# Patient Record
Sex: Male | Born: 1960 | Race: Black or African American | Hispanic: No | Marital: Single | Smoking: Current every day smoker
Health system: Southern US, Community
[De-identification: ages and names within clinical notes are randomized; demographics above are authoritative.]

## PROBLEM LIST (undated history)

## (undated) DIAGNOSIS — I739 Peripheral vascular disease, unspecified: Secondary | ICD-10-CM

## (undated) DIAGNOSIS — E785 Hyperlipidemia, unspecified: Secondary | ICD-10-CM

## (undated) DIAGNOSIS — I4891 Unspecified atrial fibrillation: Secondary | ICD-10-CM

## (undated) DIAGNOSIS — J45909 Unspecified asthma, uncomplicated: Secondary | ICD-10-CM

## (undated) DIAGNOSIS — R0600 Dyspnea, unspecified: Secondary | ICD-10-CM

## (undated) DIAGNOSIS — Z955 Presence of coronary angioplasty implant and graft: Secondary | ICD-10-CM

## (undated) DIAGNOSIS — I2510 Atherosclerotic heart disease of native coronary artery without angina pectoris: Secondary | ICD-10-CM

## (undated) DIAGNOSIS — R519 Headache, unspecified: Secondary | ICD-10-CM

## (undated) DIAGNOSIS — I10 Essential (primary) hypertension: Secondary | ICD-10-CM

## (undated) DIAGNOSIS — M1990 Unspecified osteoarthritis, unspecified site: Secondary | ICD-10-CM

## (undated) DIAGNOSIS — R42 Dizziness and giddiness: Secondary | ICD-10-CM

## (undated) DIAGNOSIS — G4730 Sleep apnea, unspecified: Secondary | ICD-10-CM

## (undated) DIAGNOSIS — J449 Chronic obstructive pulmonary disease, unspecified: Secondary | ICD-10-CM

## (undated) HISTORY — PX: OTHER SURGICAL HISTORY: SHX169

---
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
## 2014-08-20 DIAGNOSIS — Z955 Presence of coronary angioplasty implant and graft: Secondary | ICD-10-CM

## 2014-08-20 HISTORY — DX: Presence of coronary angioplasty implant and graft: Z95.5

## 2019-03-16 ENCOUNTER — Other Ambulatory Visit: Payer: Self-pay

## 2019-03-16 ENCOUNTER — Telehealth: Payer: Self-pay | Admitting: Gastroenterology

## 2019-03-16 DIAGNOSIS — Z1211 Encounter for screening for malignant neoplasm of colon: Secondary | ICD-10-CM

## 2019-03-16 NOTE — Telephone Encounter (Signed)
Patient called & l/m he would like to scheduled a colonoscopy & is in the work-q.

## 2019-03-16 NOTE — Telephone Encounter (Signed)
Gastroenterology Pre-Procedure Review  Request Date: 05/01/19 Requesting Physician: Dr. Bonna Gains  PATIENT REVIEW QUESTIONS: The patient responded to the following health history questions as indicated:    1. Are you having any GI issues? no 2. Do you have a personal history of Polyps? no 3. Do you have a family history of Colon Cancer or Polyps? no 4. Diabetes Mellitus? no 5. Joint replacements in the past 12 months?no 6. Major health problems in the past 3 months?no 7. Any artificial heart valves, MVP, or defibrillator?no    MEDICATIONS & ALLERGIES:    Patient reports the following regarding taking any anticoagulation/antiplatelet therapy:   Plavix, Coumadin, Eliquis, Xarelto, Lovenox, Pradaxa, Brilinta, or Effient? yes (Eliquis, Plavix blood thinner request sent to [facility] Aspirin? no  Patient confirms/reports the following medications:  No current outpatient medications on file.   No current facility-administered medications for this visit.     Patient confirms/reports the following allergies:  Not on File  No orders of the defined types were placed in this encounter.   AUTHORIZATION INFORMATION Primary Insurance: 1D#: Group #:  Secondary Insurance: 1D#: Group #:  SCHEDULE INFORMATION: Date: 05/01/19 Time: Location:[facility]

## 2019-04-13 ENCOUNTER — Telehealth: Payer: Self-pay

## 2019-04-13 NOTE — Telephone Encounter (Signed)
Patient has been advised to stop Eliquis and Plavix 2 days prior to procedure and resume 2 days after the procedure-per Dr. Dominica Severin blood thinner request received.  Pts colonoscopy is scheduled for 05/01/19 and he states he has received his instruction packet.  Thanks [employer]

## 2019-04-28 ENCOUNTER — Other Ambulatory Visit: Payer: Self-pay

## 2019-04-28 ENCOUNTER — Other Ambulatory Visit
Admission: RE | Admit: 2019-04-28 | Discharge: 2019-04-28 | Disposition: A | Discharge status: Home / Self Care | Payer: Medicare HMO | Source: Ambulatory Visit | Attending: Gastroenterology | Admitting: Gastroenterology

## 2019-04-28 DIAGNOSIS — Z20828 Contact with and (suspected) exposure to other viral communicable diseases: Secondary | ICD-10-CM | POA: Insufficient documentation

## 2019-04-28 DIAGNOSIS — Z01812 Encounter for preprocedural laboratory examination: Secondary | ICD-10-CM | POA: Diagnosis present

## 2019-04-28 LAB — SARS CORONAVIRUS 2 (TAT 6-24 HRS): SARS Coronavirus 2: NEGATIVE

## 2019-05-01 ENCOUNTER — Ambulatory Visit
Admission: RE | Admit: 2019-05-01 | Discharge: 2019-05-01 | Disposition: A | Discharge status: Home / Self Care | Payer: Medicare HMO | Source: Ambulatory Visit | Attending: Gastroenterology | Admitting: Gastroenterology

## 2019-05-01 ENCOUNTER — Encounter
Admission: RE | Disposition: A | Discharge status: Home / Self Care | Payer: Self-pay | Source: Ambulatory Visit | Attending: Gastroenterology

## 2019-05-01 ENCOUNTER — Ambulatory Visit: Payer: Medicare HMO | Admitting: Anesthesiology

## 2019-05-01 ENCOUNTER — Encounter: Payer: Self-pay | Admitting: *Deleted

## 2019-05-01 DIAGNOSIS — F17210 Nicotine dependence, cigarettes, uncomplicated: Secondary | ICD-10-CM | POA: Diagnosis not present

## 2019-05-01 DIAGNOSIS — Z1211 Encounter for screening for malignant neoplasm of colon: Secondary | ICD-10-CM | POA: Diagnosis not present

## 2019-05-01 DIAGNOSIS — K635 Polyp of colon: Secondary | ICD-10-CM | POA: Diagnosis not present

## 2019-05-01 DIAGNOSIS — G4730 Sleep apnea, unspecified: Secondary | ICD-10-CM | POA: Insufficient documentation

## 2019-05-01 DIAGNOSIS — D125 Benign neoplasm of sigmoid colon: Secondary | ICD-10-CM | POA: Diagnosis not present

## 2019-05-01 DIAGNOSIS — I739 Peripheral vascular disease, unspecified: Secondary | ICD-10-CM | POA: Diagnosis not present

## 2019-05-01 DIAGNOSIS — I2510 Atherosclerotic heart disease of native coronary artery without angina pectoris: Secondary | ICD-10-CM | POA: Insufficient documentation

## 2019-05-01 DIAGNOSIS — D128 Benign neoplasm of rectum: Secondary | ICD-10-CM | POA: Insufficient documentation

## 2019-05-01 DIAGNOSIS — I4891 Unspecified atrial fibrillation: Secondary | ICD-10-CM | POA: Diagnosis not present

## 2019-05-01 DIAGNOSIS — Z79899 Other long term (current) drug therapy: Secondary | ICD-10-CM | POA: Insufficient documentation

## 2019-05-01 DIAGNOSIS — Z7902 Long term (current) use of antithrombotics/antiplatelets: Secondary | ICD-10-CM | POA: Diagnosis not present

## 2019-05-01 DIAGNOSIS — Z7901 Long term (current) use of anticoagulants: Secondary | ICD-10-CM | POA: Insufficient documentation

## 2019-05-01 DIAGNOSIS — E785 Hyperlipidemia, unspecified: Secondary | ICD-10-CM | POA: Diagnosis not present

## 2019-05-01 DIAGNOSIS — D122 Benign neoplasm of ascending colon: Secondary | ICD-10-CM | POA: Diagnosis not present

## 2019-05-01 DIAGNOSIS — I10 Essential (primary) hypertension: Secondary | ICD-10-CM | POA: Diagnosis not present

## 2019-05-01 DIAGNOSIS — D120 Benign neoplasm of cecum: Secondary | ICD-10-CM | POA: Diagnosis not present

## 2019-05-01 DIAGNOSIS — K621 Rectal polyp: Secondary | ICD-10-CM | POA: Diagnosis not present

## 2019-05-01 DIAGNOSIS — J45909 Unspecified asthma, uncomplicated: Secondary | ICD-10-CM | POA: Diagnosis not present

## 2019-05-01 HISTORY — DX: Unspecified atrial fibrillation: I48.91

## 2019-05-01 HISTORY — DX: Essential (primary) hypertension: I10

## 2019-05-01 HISTORY — DX: Peripheral vascular disease, unspecified: I73.9

## 2019-05-01 HISTORY — DX: Sleep apnea, unspecified: G47.30

## 2019-05-01 HISTORY — DX: Unspecified asthma, uncomplicated: J45.909

## 2019-05-01 HISTORY — DX: Hyperlipidemia, unspecified: E78.5

## 2019-05-01 HISTORY — PX: COLONOSCOPY WITH PROPOFOL: SHX5780

## 2019-05-01 HISTORY — DX: Atherosclerotic heart disease of native coronary artery without angina pectoris: I25.10

## 2019-05-01 SURGERY — COLONOSCOPY WITH PROPOFOL
Anesthesia: General

## 2019-05-01 MED ORDER — MIDAZOLAM HCL 2 MG/2ML IJ SOLN
INTRAMUSCULAR | Status: DC | PRN
  Administered 2019-05-01: 2 mg via INTRAVENOUS

## 2019-05-01 MED ORDER — SODIUM CHLORIDE 0.9 % IV SOLN
INTRAVENOUS | Status: DC
  Administered 2019-05-01 (×2): via INTRAVENOUS

## 2019-05-01 MED ORDER — PROPOFOL 10 MG/ML IV BOLUS
INTRAVENOUS | Status: DC | PRN
  Administered 2019-05-01: 30 mg via INTRAVENOUS
  Administered 2019-05-01: 100 mg via INTRAVENOUS

## 2019-05-01 MED ORDER — PROPOFOL 500 MG/50ML IV EMUL
INTRAVENOUS | Status: AC
  Filled 2019-05-01: qty 50

## 2019-05-01 MED ORDER — PROPOFOL 500 MG/50ML IV EMUL
INTRAVENOUS | Status: DC | PRN
  Administered 2019-05-01: 150 ug/kg/min via INTRAVENOUS

## 2019-05-01 MED ORDER — LIDOCAINE HCL (CARDIAC) PF 100 MG/5ML IV SOSY
PREFILLED_SYRINGE | INTRAVENOUS | Status: DC | PRN
  Administered 2019-05-01: 40 mg via INTRAVENOUS

## 2019-05-01 MED ORDER — MIDAZOLAM HCL 2 MG/2ML IJ SOLN
INTRAMUSCULAR | Status: AC
  Filled 2019-05-01: qty 2

## 2019-05-01 MED ORDER — LIDOCAINE HCL (PF) 2 % IJ SOLN
INTRAMUSCULAR | Status: AC
  Filled 2019-05-01: qty 10

## 2019-05-01 MED ORDER — SODIUM CHLORIDE (PF) 0.9 % IJ SOLN
INTRAMUSCULAR | Status: DC | PRN
  Administered 2019-05-01: 4 mL

## 2019-05-01 NOTE — H&P (Signed)
Vonda Antigua, MD [address] [place], [place], [place], [address] [address], [place], [place], [place], [address] Phone: [phone]  Fax: [phone]  Primary Care Physician:  Kerri Perches, PA-C   Pre-Procedure History & Physical: HPI:  Adam Yoder is a 58 y.o. male is here for a colonoscopy.   Past Medical History:  Diagnosis Date  . Asthma   . Atrial fibrillation ([place])   . Coronary artery disease   . H/O heart artery stent 2016  . Hyperlipidemia   . Hypertension   . PAD (peripheral artery disease) ([place])   . Sleep apnea     Past Surgical History:  Procedure Laterality Date  . cardiac bipass     right knee    Prior to Admission medications   Medication Sig Start Date End Date Taking? Authorizing Provider  albuterol (VENTOLIN HFA) 108 (90 Base) MCG/ACT inhaler Inhale 2 puffs into the lungs every 6 (six) hours as needed for wheezing or shortness of breath.   Yes [provider]  apixaban (ELIQUIS) 5 MG TABS tablet Take 5 mg by mouth 2 (two) times daily.   Yes [provider]  carvedilol (COREG) 25 MG tablet Take 25 mg by mouth 2 (two) times daily with a meal.   Yes [provider]  clopidogrel (PLAVIX) 75 MG tablet Take 75 mg by mouth daily.   Yes [provider]  hydrochlorothiazide (HYDRODIURIL) 25 MG tablet Take 25 mg by mouth daily.   Yes [provider]  simvastatin (ZOCOR) 40 MG tablet Take 40 mg by mouth daily.   Yes [provider]    Allergies as of 03/17/2019  . (Not on File)    History reviewed. No pertinent family history.  Social History   Socioeconomic History  . Marital status: Single    Spouse name: Not on file  . Number of children: Not on file  . Years of education: Not on file  . Highest education level: Not on file  Occupational History  . Not on file  Social Needs  . Financial resource strain: Not on file  . Food insecurity    Worry: Not on file    Inability: Not  on file  . Transportation needs    Medical: Not on file    Non-medical: Not on file  Tobacco Use  . Smoking status: Current Every Day Smoker    Years: 40.00    Types: Cigarettes  . Smokeless tobacco: Never Used  Substance and Sexual Activity  . Alcohol use: Yes    Comment: occassional   . Drug use: Yes    Types: Marijuana  . Sexual activity: Not on file  Lifestyle  . Physical activity    Days per week: Not on file    Minutes per session: Not on file  . Stress: Not on file  Relationships  . Social Herbalist on phone: Not on file    Gets together: Not on file    Attends religious service: Not on file    Active member of club or organization: Not on file    Attends meetings of clubs or organizations: Not on file    Relationship status: Not on file  . Intimate partner violence    Fear of current or ex partner: Not on file    Emotionally abused: Not on file    Physically abused: Not on file    Forced sexual activity: Not on file  Other Topics Concern  . Not  on file  Social History Narrative  . Not on file    Review of Systems: See HPI, otherwise negative ROS  Physical Exam: BP 139/73   Pulse 73   Temp 98.2 F (36.8 C) (Oral)   Resp 20   Ht 5\' 8"  (1.727 m)   Wt 130.6 kg   SpO2 99%   BMI 43.79 kg/m  General:   Alert,  pleasant and cooperative in NAD Head:  Normocephalic and atraumatic. Neck:  Supple; no masses or thyromegaly. Lungs:  Clear throughout to auscultation, normal respiratory effort.    Heart:  +S1, +S2, Regular rate and rhythm, No edema. Abdomen:  Soft, nontender and nondistended. Normal bowel sounds, without guarding, and without rebound.   Neurologic:  Alert and  oriented x4;  grossly normal neurologically.  Impression/Plan: Adam Yoder is here for a colonoscopy to be performed for average risk screening.  Risks, benefits, limitations, and alternatives regarding  colonoscopy have been reviewed with the patient.  Questions have been  answered.  All parties agreeable.   Virgel Manifold, MD  05/01/2019, 8:53 AM

## 2019-05-01 NOTE — Transfer of Care (Signed)
Immediate Anesthesia Transfer of Care Note  Patient: Orlie Schwinghammer  Procedure(s) Performed: COLONOSCOPY WITH PROPOFOL (N/A )  Patient Location: PACU  Anesthesia Type:General  Level of Consciousness: awake and alert   Airway & Oxygen Therapy: Patient Spontanous Breathing and Patient connected to nasal cannula oxygen  Post-op Assessment: Report given to RN and Post -op Vital signs reviewed and stable  Post vital signs: Reviewed and stable  Last Vitals:  Vitals Value Taken Time  BP 125/76 05/01/19 1012  Temp 36.2 C 05/01/19 1012  Pulse 69 05/01/19 1012  Resp 20 05/01/19 1012  SpO2 98 % 05/01/19 1012  Vitals shown include unvalidated device data.  Last Pain:  Vitals:   05/01/19 1012  TempSrc: Temporal  PainSc:          Complications: No apparent anesthesia complications

## 2019-05-01 NOTE — Op Note (Signed)
[facility] Gastroenterology Patient Name: Adam Yoder Procedure Date: 05/01/2019 8:52 AM MRN: [id number] Account #: [id number] Date of Birth: 21-Aug-1960 Admit Type: Outpatient Age: 58 Room: [facility] ENDO ROOM 1 Gender: Male Note Status: Finalized Procedure:            Colonoscopy Indications:          Screening for colorectal malignant neoplasm Providers:            Oluwaseun Bruyere B. Bonna Gains MD, MD Referring MD:         Forest Gleason Md, MD (Referring MD) Medicines:            Monitored Anesthesia Care Complications:        No immediate complications. Procedure:            Pre-Anesthesia Assessment:                       - ASA Grade Assessment: II - A patient with mild                        systemic disease.                       - Prior to the procedure, a History and Physical was                        performed, and patient medications, allergies and                        sensitivities were reviewed. The patient's tolerance of                        previous anesthesia was reviewed.                       - The risks and benefits of the procedure and the                        sedation options and risks were discussed with the                        patient. All questions were answered and informed                        consent was obtained.                       - Patient identification and proposed procedure were                        verified prior to the procedure by the physician, the                        nurse, the anesthesiologist, the anesthetist and the                        technician. The procedure was verified in the procedure                        room.  After obtaining informed consent, the colonoscope was                        passed under direct vision. Throughout the procedure,                        the patient's blood pressure, pulse, and oxygen                        saturations were monitored continuously. The               Colonoscope was introduced through the anus and                        advanced to the the cecum, identified by appendiceal                        orifice and ileocecal valve. The colonoscopy was                        performed with ease. The patient tolerated the                        procedure well. The quality of the bowel preparation                        was fair. Findings:      The perianal and digital rectal examinations were normal.      A 11 mm polyp was found in the cecum. The polyp was sessile. Area was       successfully injected with saline for lesion assessment, and this       injection appeared to lift the lesion adequately. Polypectomy was       attempted, initially using a piecemeal technique with a hot snare. Polyp       resection was incomplete with this device. This intervention then       required a different device and polypectomy technique. The polyp was       removed with a cold biopsy forceps. Resection was complete, and       retrieval was complete. The polyp was first removed with hot snare. A       small piece was left and it was attempted to be removed via cold snare.       Another smaller piece was left and it was completely removed via biopsy       forceps.      Six sessile polyps were found in the rectum, descending colon, ascending       colon and cecum. The polyps were 4 to 6 mm in size. These polyps were       removed with a cold snare. Resection and retrieval were complete.      A 4 mm polyp was found in the ascending colon. The polyp was sessile.       The polyp was removed with a cold biopsy forceps. Resection and       retrieval were complete.      Two pedunculated and sessile polyps were found in the sigmoid colon and       descending colon. The polyps were 10 to 11 mm in size. These polyps were       removed with a hot snare.  Resection and retrieval were complete. The       sigmoid colon polyp was removed via roth net      The exam  was otherwise without abnormality.      The rectum, sigmoid colon, descending colon, transverse colon, ascending       colon and cecum appeared normal.      No additional abnormalities were found on retroflexion. Impression:           - Preparation of the colon was fair.                       - One 11 mm polyp in the cecum, removed with a cold                        biopsy forceps. Resected and retrieved. Injected.                       - Six 4 to 6 mm polyps in the rectum, in the descending                        colon, in the ascending colon and in the cecum, removed                        with a cold snare. Resected and retrieved.                       - One 4 mm polyp in the ascending colon, removed with a                        cold biopsy forceps. Resected and retrieved.                       - Two 10 to 11 mm polyps in the sigmoid colon and in                        the descending colon, removed with a hot snare.                        Resected and retrieved.                       - The examination was otherwise normal.                       - The rectum, sigmoid colon, descending colon,                        transverse colon, ascending colon and cecum are normal. Recommendation:       - Discharge patient to home (with escort).                       - Advance diet as tolerated.                       - Continue present medications.                       - Await pathology results.                       -  Repeat colonoscopy in 3-6 months with 2 day prep.                       - The findings and recommendations were discussed with                        the patient.                       - The findings and recommendations were discussed with                        the patient's family.                       - Return to primary care physician as previously                        scheduled. Procedure Code(s):    --- Professional ---                       [phone], Colonoscopy, flexible;  with removal of tumor(s),                        polyp(s), or other lesion(s) by snare technique                       [id number], Colonoscopy, flexible; with directed submucosal                        injection(s), any substance                       [id number], 59, Colonoscopy, flexible; with biopsy, single                        or multiple Diagnosis Code(s):    --- Professional ---                       Z12.11, Encounter for screening for malignant neoplasm                        of colon                       K63.5, Polyp of colon                       K62.1, Rectal polyp CPT copyright 2019 American Medical Association. All rights reserved. The codes documented in this report are preliminary and upon coder review may  be revised to meet current compliance requirements.  Vonda Antigua, MD Margretta Sidle B. Bonna Gains MD, MD 05/01/2019 10:17:59 AM This report has been signed electronically. Number of Addenda: 0 Note Initiated On: 05/01/2019 8:52 AM Scope Withdrawal Time: 0 hours 54 minutes 18 seconds  Total Procedure Duration: 1 hour 0 minutes 49 seconds  Estimated Blood Loss: Estimated blood loss: none.      [facility]

## 2019-05-01 NOTE — Anesthesia Post-op Follow-up Note (Signed)
Anesthesia QCDR form completed.        

## 2019-05-01 NOTE — Anesthesia Postprocedure Evaluation (Signed)
Anesthesia Post Note  Patient: Adam Yoder  Procedure(s) Performed: COLONOSCOPY WITH PROPOFOL (N/A )  Patient location during evaluation: PACU Anesthesia Type: General Level of consciousness: awake and alert Pain management: pain level controlled Vital Signs Assessment: post-procedure vital signs reviewed and stable Respiratory status: spontaneous breathing, nonlabored ventilation, respiratory function stable and patient connected to nasal cannula oxygen Cardiovascular status: blood pressure returned to baseline and stable Postop Assessment: no apparent nausea or vomiting Anesthetic complications: no     Last Vitals:  Vitals:   05/01/19 1020 05/01/19 1024  BP: (!) 161/89 (!) 150/91  Pulse: (!) 57 (!) 55  Resp: (!) 21 17  Temp:    SpO2: 100% 100%    Last Pain:  Vitals:   05/01/19 1020  TempSrc:   PainSc: 0-No pain                 Molli Barrows

## 2019-05-01 NOTE — Anesthesia Preprocedure Evaluation (Addendum)
  Anesthesia Plan  ASA: III  Anesthesia Plan: General   Post-op Pain Management:    Induction:   PONV Risk Score and Plan:   Airway Management Planned:   Additional Equipment:   Intra-op Plan:   Post-operative Plan:   Informed Consent: I have reviewed the patients History and Physical, chart, labs and discussed the procedure including the risks, benefits and alternatives for the proposed anesthesia with the patient or authorized representative who has indicated his/her understanding and acceptance.     Dental Advisory Given  Plan Discussed with: CRNA  Anesthesia Plan Comments:         Anesthesia Quick Evaluation

## 2019-05-04 ENCOUNTER — Encounter: Payer: Self-pay | Admitting: Gastroenterology

## 2019-05-04 LAB — SURGICAL PATHOLOGY

## 2019-08-05 ENCOUNTER — Ambulatory Visit: Payer: Medicare HMO | Admitting: Gastroenterology

## 2019-08-05 ENCOUNTER — Encounter: Payer: Self-pay | Admitting: Gastroenterology

## 2019-08-28 ENCOUNTER — Other Ambulatory Visit: Payer: Self-pay

## 2019-08-31 ENCOUNTER — Encounter: Payer: Self-pay | Admitting: Gastroenterology

## 2019-08-31 ENCOUNTER — Ambulatory Visit: Payer: Medicare HMO | Admitting: Gastroenterology

## 2019-08-31 ENCOUNTER — Other Ambulatory Visit: Payer: Self-pay

## 2019-08-31 VITALS — BP 121/80 | HR 72 | Temp 98.4°F | Ht 68.0 in | Wt 293.1 lb

## 2019-08-31 DIAGNOSIS — Z86010 Personal history of colonic polyps: Secondary | ICD-10-CM

## 2019-08-31 NOTE — Progress Notes (Signed)
Vonda Antigua, MD [address]  [place]  [place], [place] [address]  Main: [phone]  Fax: [phone]   Primary Care Physician: Kerri Perches, PA-C   Chief Complaint  Patient presents with  . New Patient (Initial Visit)  . Colon Polyps    HPI: Adam Yoder is a 59 y.o. male previously seen during a screening colonoscopy with multiple polyps removed, here for follow-up. The patient denies abdominal or flank pain, anorexia, nausea or vomiting, dysphagia, change in bowel habits or black or bloody stools or weight loss.  No family history of colon cancer or or multiple polyps.  Last colonoscopy, September 2020 with fair prep 11 mm cecal polyp removed piecemeal via EMR, 7 other subcentimeter polyps removed throughout the colon, two 10-93mm colon polyp was removed in the descending and sigmoid colon.  Pathology showed tubular adenomas and hyperplastic polyps.  Repeat colonoscopy recommended in 3 to 6 months due to fair prep and number of polyps removed and piecemeal polypectomy in the cecum  Current Outpatient Medications  Medication Sig Dispense Refill  . albuterol (VENTOLIN HFA) 108 (90 Base) MCG/ACT inhaler Inhale 2 puffs into the lungs every 6 (six) hours as needed for wheezing or shortness of breath.    Marland Kitchen apixaban (ELIQUIS) 5 MG TABS tablet Take 5 mg by mouth 2 (two) times daily.    . carvedilol (COREG) 25 MG tablet Take 25 mg by mouth 2 (two) times daily with a meal.    . clopidogrel (PLAVIX) 75 MG tablet Take 75 mg by mouth daily.    Marland Kitchen FLOVENT HFA 220 MCG/ACT inhaler     . hydrochlorothiazide (HYDRODIURIL) 25 MG tablet Take 25 mg by mouth daily.    . metoprolol succinate (TOPROL-XL) 50 MG 24 hr tablet     . simvastatin (ZOCOR) 40 MG tablet Take 40 mg by mouth daily.     No current facility-administered medications for this visit.    Allergies as of 08/31/2019  . (No Known Allergies)    ROS:  General: Negative for anorexia, weight loss, fever, chills,  fatigue, weakness. ENT: Negative for hoarseness, difficulty swallowing , nasal congestion. CV: Negative for chest pain, angina, palpitations, dyspnea on exertion, peripheral edema.  Respiratory: Negative for dyspnea at rest, dyspnea on exertion, cough, sputum, wheezing.  GI: See history of present illness. GU:  Negative for dysuria, hematuria, urinary incontinence, urinary frequency, nocturnal urination.  Endo: Negative for unusual weight change.    Physical Examination:   BP 121/80 (BP Location: Left Arm, Patient Position: Sitting, Cuff Size: Normal)   Pulse 72   Temp 98.4 F (36.9 C) (Oral)   Ht 5\' 8"  (1.727 m)   Wt 293 lb 2 oz (133 kg)   BMI 44.57 kg/m   General: Well-nourished, well-developed in no acute distress.  Eyes: No icterus. Conjunctivae pink. Mouth: Oropharyngeal mucosa moist and pink , no lesions erythema or exudate. Neck: Supple, Trachea midline Abdomen: Bowel sounds are normal, nontender, nondistended, no hepatosplenomegaly or masses, no abdominal bruits or hernia , no rebound or guarding.   Extremities: No lower extremity edema. No clubbing or deformities. Neuro: Alert and oriented x 3.  Grossly intact. Skin: Warm and dry, no jaundice.   Psych: Alert and cooperative, normal mood and affect.   Labs: CMP  No results found for: NA, K, CL, CO2, GLUCOSE, BUN, CREATININE, CALCIUM, PROT, ALBUMIN, AST, ALT, ALKPHOS, BILITOT, GFRNONAA, GFRAA No results found for: WBC, HGB, HCT, MCV, PLT  Imaging Studies: No results found.  Assessment and Plan:   Adam Yoder is a 59 y.o. y/o male here for follow-up of history of colon polyps  Patient is due for repeat colonoscopy due to fair prep and piecemeal polypectomy technique and number of polyps removed for reevaluation  However, elective procedures are not able to be scheduled past the next week due to increase in COVID-19 cases and cancellation of elective procedures at [facility] as per cone Policies  Patient may need to be  placed on a wait list for this elective procedure and he verbalizes understanding.  He understands the importance of follow-up with [place] within the next 3 to 6 months to schedule his procedures in a timely fashion.    Dr Vonda Antigua

## 2019-09-09 ENCOUNTER — Other Ambulatory Visit: Payer: Self-pay

## 2019-09-09 ENCOUNTER — Telehealth: Payer: Self-pay

## 2019-09-09 DIAGNOSIS — Z86010 Personal history of colonic polyps: Secondary | ICD-10-CM

## 2019-09-09 MED ORDER — NA SULFATE-K SULFATE-MG SULF 17.5-3.13-1.6 GM/177ML PO SOLN: 354.0000 mL | Freq: Once | ORAL | 0 refills | Status: AC

## 2019-09-09 NOTE — Telephone Encounter (Signed)
Dr. Fuller Song office stated to stop the Eliquis 2 days before procedure and restart it 2 days after procedure. Called patient and scheduled patient procedure on 09/29/2019 with Dr. Vicente Males. Went over instructions with patient and patient states do not mail the instructions but he will come by and pick them up. Sent prep to pharmacy

## 2019-09-11 ENCOUNTER — Encounter: Payer: Self-pay | Admitting: Emergency Medicine

## 2019-09-11 ENCOUNTER — Emergency Department: Payer: Medicare HMO

## 2019-09-11 ENCOUNTER — Other Ambulatory Visit: Payer: Self-pay

## 2019-09-11 ENCOUNTER — Emergency Department
Admission: EM | Admit: 2019-09-11 | Discharge: 2019-09-11 | Disposition: A | Discharge status: Home / Self Care | Payer: Medicare HMO | Attending: Emergency Medicine | Admitting: Emergency Medicine

## 2019-09-11 DIAGNOSIS — Z79899 Other long term (current) drug therapy: Secondary | ICD-10-CM | POA: Diagnosis not present

## 2019-09-11 DIAGNOSIS — J45909 Unspecified asthma, uncomplicated: Secondary | ICD-10-CM | POA: Insufficient documentation

## 2019-09-11 DIAGNOSIS — F17210 Nicotine dependence, cigarettes, uncomplicated: Secondary | ICD-10-CM | POA: Insufficient documentation

## 2019-09-11 DIAGNOSIS — I2510 Atherosclerotic heart disease of native coronary artery without angina pectoris: Secondary | ICD-10-CM | POA: Diagnosis not present

## 2019-09-11 DIAGNOSIS — R55 Syncope and collapse: Secondary | ICD-10-CM | POA: Diagnosis present

## 2019-09-11 DIAGNOSIS — Z7901 Long term (current) use of anticoagulants: Secondary | ICD-10-CM | POA: Insufficient documentation

## 2019-09-11 DIAGNOSIS — I10 Essential (primary) hypertension: Secondary | ICD-10-CM | POA: Insufficient documentation

## 2019-09-11 DIAGNOSIS — M79604 Pain in right leg: Secondary | ICD-10-CM | POA: Diagnosis not present

## 2019-09-11 DIAGNOSIS — I4811 Longstanding persistent atrial fibrillation: Secondary | ICD-10-CM | POA: Diagnosis not present

## 2019-09-11 DIAGNOSIS — R0602 Shortness of breath: Secondary | ICD-10-CM | POA: Insufficient documentation

## 2019-09-11 LAB — CBC WITH DIFFERENTIAL/PLATELET
Abs Immature Granulocytes: 0.02 10*3/uL (ref 0.00–0.07)
Basophils Absolute: 0 10*3/uL (ref 0.0–0.1)
Basophils Relative: 1 %
Eosinophils Absolute: 0 10*3/uL (ref 0.0–0.5)
Eosinophils Relative: 0 %
HCT: 44.8 % (ref 39.0–52.0)
Hemoglobin: 15.2 g/dL (ref 13.0–17.0)
Immature Granulocytes: 0 %
Lymphocytes Relative: 25 %
Lymphs Abs: 1.9 10*3/uL (ref 0.7–4.0)
MCH: 30 pg (ref 26.0–34.0)
MCHC: 33.9 g/dL (ref 30.0–36.0)
MCV: 88.5 fL (ref 80.0–100.0)
Monocytes Absolute: 0.7 10*3/uL (ref 0.1–1.0)
Monocytes Relative: 9 %
Neutro Abs: 4.9 10*3/uL (ref 1.7–7.7)
Neutrophils Relative %: 65 %
Platelets: 254 10*3/uL (ref 150–400)
RBC: 5.06 MIL/uL (ref 4.22–5.81)
RDW: 12.5 % (ref 11.5–15.5)
WBC: 7.6 10*3/uL (ref 4.0–10.5)
nRBC: 0 % (ref 0.0–0.2)

## 2019-09-11 LAB — COMPREHENSIVE METABOLIC PANEL
ALT: 39 U/L (ref 0–44)
AST: 54 U/L — ABNORMAL HIGH (ref 15–41)
Albumin: 3.9 g/dL (ref 3.5–5.0)
Alkaline Phosphatase: 80 U/L (ref 38–126)
Anion gap: 12 (ref 5–15)
BUN: 12 mg/dL (ref 6–20)
CO2: 26 mmol/L (ref 22–32)
Calcium: 8.7 mg/dL — ABNORMAL LOW (ref 8.9–10.3)
Chloride: 100 mmol/L (ref 98–111)
Creatinine, Ser: 0.84 mg/dL (ref 0.61–1.24)
GFR calc Af Amer: >60 mL/min (ref >60)
GFR calc non Af Amer: >60 mL/min (ref >60)
Glucose, Bld: 121 mg/dL — ABNORMAL HIGH (ref 70–99)
Potassium: 3 mmol/L — ABNORMAL LOW (ref 3.5–5.1)
Sodium: 138 mmol/L (ref 135–145)
Total Bilirubin: 0.8 mg/dL (ref 0.3–1.2)
Total Protein: 7.4 g/dL (ref 6.5–8.1)

## 2019-09-11 LAB — BRAIN NATRIURETIC PEPTIDE: B Natriuretic Peptide: 56 pg/mL (ref 0.0–100.0)

## 2019-09-11 LAB — FIBRIN DERIVATIVES D-DIMER (ARMC ONLY): Fibrin derivatives D-dimer (ARMC): 998.27 ng/mL (FEU) — ABNORMAL HIGH (ref 0.00–499.00)

## 2019-09-11 LAB — TROPONIN I (HIGH SENSITIVITY)
Troponin I (High Sensitivity): 10 ng/L (ref <18)
Troponin I (High Sensitivity): 10 ng/L (ref <18)

## 2019-09-11 LAB — MAGNESIUM: Magnesium: 1.7 mg/dL (ref 1.7–2.4)

## 2019-09-11 MED ORDER — IOHEXOL 350 MG/ML SOLN
100.0000 mL | Freq: Once | INTRAVENOUS | Status: AC | PRN
  Administered 2019-09-11: 100 mL via INTRAVENOUS

## 2019-09-11 MED ORDER — METOPROLOL TARTRATE 50 MG PO TABS: 50.0000 mg | ORAL_TABLET | Freq: Two times a day (BID) | ORAL | 0 refills | Status: DC

## 2019-09-11 MED ORDER — METOPROLOL TARTRATE 5 MG/5ML IV SOLN: 2.5000 mg | Freq: Once | INTRAVENOUS | Status: DC

## 2019-09-11 MED ORDER — SODIUM CHLORIDE 0.9 % IV BOLUS
1000.0000 mL | Freq: Once | INTRAVENOUS | Status: AC
  Administered 2019-09-11: 1000 mL via INTRAVENOUS

## 2019-09-11 MED ORDER — METOPROLOL SUCCINATE ER 50 MG PO TB24
25.0000 mg | ORAL_TABLET | Freq: Every day | ORAL | Status: DC
  Administered 2019-09-11: 19:00:00 25 mg via ORAL
  Filled 2019-09-11: qty 1

## 2019-09-11 MED ORDER — METOPROLOL SUCCINATE ER 50 MG PO TB24: 100.0000 mg | ORAL_TABLET | Freq: Every day | ORAL | 0 refills | Status: AC

## 2019-09-11 MED ORDER — POTASSIUM CHLORIDE CRYS ER 20 MEQ PO TBCR
40.0000 meq | EXTENDED_RELEASE_TABLET | Freq: Once | ORAL | Status: AC
  Administered 2019-09-11: 17:00:00 40 meq via ORAL
  Filled 2019-09-11: qty 2

## 2019-09-11 NOTE — ED Provider Notes (Signed)
[facility] Emergency Department Provider Note  ____________________________________________   First MD Initiated Contact with Patient 09/11/19 1504     (approximate)  I have reviewed the triage vital signs and the nursing notes.   HISTORY  Chief Complaint Near Syncope    HPI Adam Yoder is a 59 y.o. male  With h/o AFib, CAD, HTN, HLD, PAD here with SOB, lightheadedness. Pt states that he was in his usual state of health until around 2 hours ago. He stood up to go into another room. While doing this, he began to feel lightheaded and short of breath, like he was going to pass out. He stumbled into a wall and his his R leg, which immediately began swelling. Since then, he has had some mild lightheadedness and fatigue. His leg swelling is slowly improving. Denies any CP. 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 He has been adherent with his medications, particularly his Eliquis. No fever, chills. No other complaints or recent illnesses.        Past Medical History:  Diagnosis Date  . Asthma   . Atrial fibrillation ([place])   . Coronary artery disease   . H/O heart artery stent 2016  . Hyperlipidemia   . Hypertension   . PAD (peripheral artery disease) ([place])   . Sleep apnea     Patient Active Problem List   Diagnosis Date Noted  . Encounter for screening colonoscopy   . Polyp of colon   . Rectal polyp     Past Surgical History:  Procedure Laterality Date  . cardiac bipass     right knee  . COLONOSCOPY WITH PROPOFOL N/A 05/01/2019   Procedure: COLONOSCOPY WITH PROPOFOL;  Surgeon: Virgel Manifold, MD;  Location: [facility] ENDOSCOPY;  Service: Endoscopy;  Laterality: N/A;    Prior to Admission medications   Medication Sig Start Date End Date Taking? Authorizing Provider  albuterol (VENTOLIN HFA) 108 (90 Base) MCG/ACT inhaler Inhale 2 puffs into the lungs every 6 (six) hours as needed for wheezing or shortness of breath.   Yes  [provider]  apixaban (ELIQUIS) 5 MG TABS tablet Take 5 mg by mouth 2 (two) times daily.   Yes [provider]  clopidogrel (PLAVIX) 75 MG tablet Take 75 mg by mouth daily.   Yes [provider]  FLOVENT HFA 220 MCG/ACT inhaler Inhale 1 puff into the lungs daily.  07/15/19  Yes [provider]  hydrochlorothiazide (HYDRODIURIL) 25 MG tablet Take 25 mg by mouth daily.   Yes [provider]  pregabalin (LYRICA) 50 MG capsule Take 50 mg by mouth 3 (three) times daily. 08/27/19  Yes [provider]  simvastatin (ZOCOR) 40 MG tablet Take 40 mg by mouth daily.   Yes [provider]  carvedilol (COREG) 25 MG tablet Take 25 mg by mouth 2 (two) times daily with a meal.    [provider]  metoprolol succinate (TOPROL XL) 50 MG 24 hr tablet Take 2 tablets (100 mg total) by mouth daily. Take with or immediately following a meal. 09/11/19 10/11/19  Duffy Bruce, MD  metoprolol tartrate (LOPRESSOR) 50 MG tablet Take 1 tablet (50 mg total) by mouth 2 (two) times daily for 7 days. 09/11/19 09/11/19  Duffy Bruce, MD    Allergies Patient has no known allergies.  History reviewed. No pertinent family history.  Social History Social History   Tobacco Use  . Smoking status: Current Every Day Smoker  Years: 40.00    Types: Cigarettes  . Smokeless tobacco: Never Used  Substance Use Topics  . Alcohol use: Not Currently    Comment: occassional   . Drug use: Yes    Types: Marijuana    Review of Systems  Review of Systems  Constitutional: Positive for fatigue. Negative for chills and fever.  HENT: Negative for sore throat.   Respiratory: Negative for shortness of breath.   Cardiovascular: Positive for palpitations. Negative for chest pain.  Gastrointestinal: Negative for abdominal pain.  Genitourinary: Negative for flank pain.  Musculoskeletal: Negative for neck pain.  Skin: Negative for rash and wound.    Allergic/Immunologic: Negative for immunocompromised state.  Neurological: Positive for weakness and light-headedness. Negative for numbness.  Hematological: Does not bruise/bleed easily.  All other systems reviewed and are negative.    ____________________________________________  PHYSICAL EXAM:      VITAL SIGNS: ED Triage Vitals  Enc Vitals Group     BP 09/11/19 1457 (!) 132/108     Pulse Rate 09/11/19 1457 84     Resp 09/11/19 1457 17     Temp 09/11/19 1457 98 F (36.7 C)     Temp Source 09/11/19 1457 Oral     SpO2 09/11/19 1457 99 %     Weight 09/11/19 1458 293 lb 2 oz (133 kg)     Height 09/11/19 1458 5\' 8"  (1.727 m)     Head Circumference --      Peak Flow --      Pain Score 09/11/19 1457 0     Pain Loc --      Pain Edu? --      Excl. in [place]? --      Physical Exam Vitals and nursing note reviewed.  Constitutional:      General: He is not in acute distress.    Appearance: He is well-developed.  HENT:     Head: Normocephalic and atraumatic.  Eyes:     Conjunctiva/sclera: Conjunctivae normal.  Cardiovascular:     Rate and Rhythm: Normal rate. Rhythm irregular.     Heart sounds: Normal heart sounds. No murmur. No friction rub.  Pulmonary:     Effort: Pulmonary effort is normal. No respiratory distress.     Breath sounds: Normal breath sounds. No wheezing or rales.  Abdominal:     General: There is no distension.     Palpations: Abdomen is soft.     Tenderness: There is no abdominal tenderness.  Musculoskeletal:     Cervical back: Neck supple.  Skin:    General: Skin is warm.     Capillary Refill: Capillary refill takes less than 2 seconds.     Comments: Moderate swelling noted to distal right lower leg. No open wounds. Mild abrasion overlying area with TTP. No deformity. No calf TTP, edema, or asymmetry. No palpable cords.  Neurological:     Mental Status: He is alert and oriented to person, place, and time.     Motor: No abnormal muscle tone.        ____________________________________________   LABS (all labs ordered are listed, but only abnormal results are displayed)  Labs Reviewed  COMPREHENSIVE METABOLIC PANEL - Abnormal; Notable for the following components:      Result Value   Potassium 3.0 (*)    Glucose, Bld 121 (*)    Calcium 8.7 (*)    AST 54 (*)    All other components within normal limits  FIBRIN DERIVATIVES D-DIMER ([facility] ONLY) - Abnormal;  Notable for the following components:   Fibrin derivatives D-dimer ([facility]) 998.27 (*)    All other components within normal limits  CBC WITH DIFFERENTIAL/PLATELET  BRAIN NATRIURETIC PEPTIDE  MAGNESIUM  TROPONIN I (HIGH SENSITIVITY)  TROPONIN I (HIGH SENSITIVITY)    ____________________________________________  EKG: Atrial fibrillation with RVR, VR 144. QRS 106, QTc 504. ST changes in inferolateral leads, no St elevations.  ________________________________________  RADIOLOGY All imaging, including plain films, CT scans, and ultrasounds, independently reviewed by me, and interpretations confirmed via formal radiology reads.  ED MD interpretation:   CXR: Clear XR Lower Leg right: Negative, soft tissue swelling noted CT ANgio: Neg US DVT: neg  Official radiology report(s): DG Tibia/Fibula Right  Result Date: 09/11/2019 CLINICAL DATA:  Right leg pain, history of DVT EXAM: RIGHT TIBIA AND FIBULA - 2 VIEW COMPARISON:  None. FINDINGS: No fracture or dislocation of the right tibia or fibula. Diffuse soft tissue edema about the lower leg. Moderate to severe tricompartmental arthrosis of the included right knee. The ankle is unremarkable. IMPRESSION: No fracture or dislocation of the right tibia or fibula. Diffuse soft tissue edema about the lower leg. Electronically Signed   By: Eddie Candle M.D.   On: 09/11/2019 16:26   CT Angio Chest PE W and/or Wo Contrast  Result Date: 09/11/2019 CLINICAL DATA:  59 year old male with shortness of breath. EXAM: CT ANGIOGRAPHY CHEST WITH  CONTRAST TECHNIQUE: Multidetector CT imaging of the chest was performed using the standard protocol during bolus administration of intravenous contrast. Multiplanar CT image reconstructions and MIPs were obtained to evaluate the vascular anatomy. CONTRAST:  143mL OMNIPAQUE IOHEXOL 350 MG/ML SOLN COMPARISON:  Chest radiograph dated 09/09/2019. FINDINGS: Cardiovascular: There is no cardiomegaly or pericardial effusion. The thoracic aorta is unremarkable. Evaluation of the pulmonary arteries is limited due to respiratory motion artifact and suboptimal opacification and timing of the contrast. No large central pulmonary artery embolus identified. Mediastinum/Nodes: There is no hilar or mediastinal adenopathy. The esophagus is grossly unremarkable. No mediastinal fluid collection. Several calcified lymph nodes noted in the upper mediastinum. Lungs/Pleura: The lungs are clear. There is no pleural effusion or pneumothorax. The central airways are patent. Upper Abdomen: Fatty infiltration of the liver. Partially calcified stones noted within the gallbladder. Musculoskeletal: Mild degenerative changes of the spine. No acute osseous pathology. Review of the MIP images confirms the above findings. IMPRESSION: No acute intrathoracic pathology. No CT evidence of large central pulmonary artery embolus. Electronically Signed   By: Anner Crete M.D.   On: 09/11/2019 17:28   US Venous Img Lower Unilateral Right  Result Date: 09/11/2019 CLINICAL DATA:  RIGHT leg pain and swelling for 1 day EXAM: RIGHT LOWER EXTREMITY VENOUS DOPPLER ULTRASOUND TECHNIQUE: Gray-scale sonography with compression, as well as color and duplex ultrasound, were performed to evaluate the deep venous system(s) from the level of the common femoral vein through the popliteal and proximal calf veins. COMPARISON:  None FINDINGS: VENOUS Normal compressibility of the common femoral, superficial femoral, and popliteal veins, as well as the visualized calf  veins. Visualized portions of profunda femoral vein and great saphenous vein unremarkable. No filling defects to suggest DVT on grayscale or color Doppler imaging. Doppler waveforms show normal direction of venous flow, normal respiratory phasicity and response to augmentation. Limited views of the contralateral common femoral vein are unremarkable. OTHER None. Limitations: none IMPRESSION: No femoropopliteal DVT nor evidence of DVT within the visualized calf veins of the RIGHT leg. If clinical symptoms are inconsistent or if there are persistent  or worsening symptoms, further imaging (possibly involving the iliac veins) may be warranted. Electronically Signed   By: Lavonia Dana M.D.   On: 09/11/2019 18:07   DG Chest Port 1 View  Result Date: 09/11/2019 CLINICAL DATA:  Syncopal episode, right leg pain EXAM: PORTABLE CHEST 1 VIEW COMPARISON:  None. FINDINGS: The heart size and mediastinal contours are within normal limits. Both lungs are clear. The visualized skeletal structures are unremarkable. IMPRESSION: No acute abnormality of the lungs in AP portable projection. Electronically Signed   By: Eddie Candle M.D.   On: 09/11/2019 16:24    ____________________________________________  PROCEDURES   Procedure(s) performed (including Critical Care):  Procedures  ____________________________________________  INITIAL IMPRESSION / MDM / [place] / ED COURSE  As part of my medical decision making, I reviewed the following data within the [place] notes reviewed and incorporated, Old chart reviewed, Notes from prior ED visits, and [place] Controlled Substance Database       *Rawson Rupinski was evaluated in Emergency Department on 09/11/2019 for the symptoms described in the history of present illness. He was evaluated in the context of the global COVID-19 pandemic, which necessitated consideration that the patient might be at risk for infection with the SARS-CoV-2 virus  that causes COVID-19. Institutional protocols and algorithms that pertain to the evaluation of patients at risk for COVID-19 are in a state of rapid change based on information released by regulatory bodies including the CDC and federal and state organizations. These policies and algorithms were followed during the patient's care in the ED.  Some ED evaluations and interventions may be delayed as a result of limited staffing during the pandemic.*  Clinical Course as of Sep 10 2005  Fri Sep 11, 2019  1618 59 yo M here with near syncopal episode, R leg pain. Re: syncopal episode - suspect this could have been related to orthostasis versus symptomatic AFib. Pt in AFib RVR on arrival, resolved spontaneously with good rate control. No recent med changes. No CP, trop negative making ACS unlikely. D-Dimer pending to eval for DVT/PE, though he's been adherent with his Eliquis. Re: his leg swelling - I suspect this is a small hematoma 2/2 hitting his leg from stumbling. Plain films pending. F/u D-Dimer but exam, location on lateral leg w/ trauma makes DVT less likely.   [CI]    Clinical Course User Index [CI] Duffy Bruce, MD    Medical Decision Making: As above.  Fortunately, imaging shows no evidence of DVT/PE.  These were obtained after positive D-dimer was noted.  Patient is on Eliquis, which she will continue.  Otherwise, he is noted to intermittently go into A. fib with RVR here.  He remains paroxysmally in A. fib though occasionally converts to sinus rhythm.  He has absolutely no chest pain and troponins are negative x2, making ACS or ischemic etiology unlikely.  I discussed with Dr. Harrington Challenger of cardiology.  Will plan to increase his metoprolol from 50 once a day to ideally 100 once a day, but will have him score his tablets to 75 once a day for the weekend.  He will call on Monday.  Return precautions given.  ____________________________________________  FINAL CLINICAL IMPRESSION(S) / ED  DIAGNOSES  Final diagnoses:  Longstanding persistent atrial fibrillation (HCC)     MEDICATIONS GIVEN DURING THIS VISIT:  Medications  metoprolol succinate (TOPROL-XL) 24 hr tablet 25 mg (25 mg Oral Given 09/11/19 1918)  sodium chloride 0.9 % bolus 1,000 mL (0  mLs Intravenous Stopped 09/11/19 1950)  potassium chloride SA (KLOR-CON) CR tablet 40 mEq (40 mEq Oral Given 09/11/19 1703)  iohexol (OMNIPAQUE) 350 MG/ML injection 100 mL (100 mLs Intravenous Contrast Given 09/11/19 1712)     ED Discharge Orders         Ordered    metoprolol tartrate (LOPRESSOR) 50 MG tablet  2 times daily,   Status:  Discontinued     09/11/19 1934    metoprolol succinate (TOPROL XL) 50 MG 24 hr tablet  Daily     09/11/19 2003           Note:  This document was prepared using Dragon voice recognition software and may include unintentional dictation errors.   Duffy Bruce, MD 09/11/19 2007

## 2019-09-11 NOTE — ED Notes (Signed)
Physical copy of discharge paperwork signed and sent to records.

## 2019-09-11 NOTE — Discharge Instructions (Addendum)
FOR YOUR HEART RHYTHM:  - We are going to increase your metoprolol tartrate medication from 1 tablet daily to 1.5 tablets daily - Take this amount for the next 2-3 days - if your BP remains over 140 and heart rate over 70, you can increase to 2 tablets a day - Call Dr. Harrington Challenger on Monday - she should be able to get you in

## 2019-09-11 NOTE — ED Notes (Signed)
Pt transported to ultrasound.

## 2019-09-11 NOTE — ED Triage Notes (Addendum)
Pt presents from home via acems with c/o near syncopal episode according to family. Pt denies LOC at home, but is c/o right leg pain. Pt has hx of DVT and states pain in leg feels similar to pain he felt with prior DVT. Pt EKG rhythm on monitor up to 180 bpm for ems. Upon arrival, pt 154 bpm on the monitor. Pt denies hx of atrial fibrillation or any other heart arrhythmias to his knowledge.  Pt currently alert and oriented x4.

## 2019-09-14 ENCOUNTER — Telehealth: Payer: Self-pay | Admitting: *Deleted

## 2019-09-14 ENCOUNTER — Encounter: Payer: Self-pay | Admitting: Internal Medicine

## 2019-09-14 ENCOUNTER — Ambulatory Visit (INDEPENDENT_AMBULATORY_CARE_PROVIDER_SITE_OTHER): Payer: Medicare HMO | Admitting: Internal Medicine

## 2019-09-14 ENCOUNTER — Other Ambulatory Visit: Payer: Self-pay

## 2019-09-14 VITALS — BP 156/85 | HR 78 | Ht 68.0 in | Wt 291.6 lb

## 2019-09-14 DIAGNOSIS — I480 Paroxysmal atrial fibrillation: Secondary | ICD-10-CM | POA: Diagnosis not present

## 2019-09-14 DIAGNOSIS — I10 Essential (primary) hypertension: Secondary | ICD-10-CM | POA: Diagnosis not present

## 2019-09-14 NOTE — Telephone Encounter (Signed)
Patient enrolled for [employer] to mail a 3 day ZIO XT long term holter monitor to his home.  Instructions included in the monitor kit.

## 2019-09-14 NOTE — Progress Notes (Signed)
Cardiology Office Note   Date:  09/14/2019   ID:  Adam Yoder, DOB 1961-07-09, MRN [id number]  PCP:  Kerri Perches, PA-C  Cardiologist:   Dorris Carnes, MD   Pt referred from ED for eval of dizziness    History of Present Illness: Adam Yoder is a 59 y.o. male with a history of hypertension, atrial fibrillation, coronary artery disease, PAD, DVT/PE, hyperlipidemia.  The pt moved to [place] about 10 months ago from [place], [facility], [facility])  He had stents placed there in past   The patient was seen in the [facility] emergency room on 09/11/19.  He said he was in his usual state of health until about 2 hours prior to being seen he stood up to get into another room and while doing that he became very lightheaded and short of breath felt like he was going to pass out.  He stumbled into a wall and with his right leg which began swelling, cut   He was concerned about a clot so he went to ED      In ED he was found to be in SR and Afib with RVR    I was called   Recomm increasing his b blocker some and would arrange for f/u    The pt had a CT of the chest showed no PE lower extremity study showed no DVT Since ED visit he has not been dizzy   Heis breathing is Ok   He denies CP     He is vague/not good historian fror previous cardiac Hx    Current Meds  Medication Sig  . albuterol (VENTOLIN HFA) 108 (90 Base) MCG/ACT inhaler Inhale 2 puffs into the lungs every 6 (six) hours as needed for wheezing or shortness of breath.  Marland Kitchen apixaban (ELIQUIS) 5 MG TABS tablet Take 5 mg by mouth 2 (two) times daily.  . carvedilol (COREG) 25 MG tablet Take 25 mg by mouth 2 (two) times daily with a meal.  . clopidogrel (PLAVIX) 75 MG tablet Take 75 mg by mouth daily.  Marland Kitchen FLOVENT HFA 220 MCG/ACT inhaler Inhale 1 puff into the lungs daily.   . hydrochlorothiazide (HYDRODIURIL) 25 MG tablet Take 50 mg by mouth daily.   . metoprolol succinate (TOPROL XL) 50 MG 24 hr tablet Take 2 tablets (100  mg total) by mouth daily. Take with or immediately following a meal.  . pregabalin (LYRICA) 50 MG capsule Take 50 mg by mouth 3 (three) times daily.  . simvastatin (ZOCOR) 40 MG tablet Take 40 mg by mouth daily.     Allergies:   Patient has no known allergies.   Past Medical History:  Diagnosis Date  . Asthma   . Atrial fibrillation ([place])   . Coronary artery disease   . H/O heart artery stent 2016  . Hyperlipidemia   . Hypertension   . PAD (peripheral artery disease) ([place])   . Sleep apnea     Past Surgical History:  Procedure Laterality Date  . cardiac bipass     right knee  . COLONOSCOPY WITH PROPOFOL N/A 05/01/2019   Procedure: COLONOSCOPY WITH PROPOFOL;  Surgeon: Virgel Manifold, MD;  Location: [facility] ENDOSCOPY;  Service: Endoscopy;  Laterality: N/A;     Social History:  The patient  reports that he has been smoking cigarettes. He has smoked for the past 40.00 years. He has never used smokeless tobacco. He reports previous alcohol use. He reports current drug use. Drug: Marijuana.  Family History:  The patient's family history is not on file.    ROS:  Please see the history of present illness. All other systems are reviewed and  Negative to the above problem except as noted.    PHYSICAL EXAM: VS:  BP (!) 156/85   Pulse 78   Ht 5\' 8"  (1.727 m)   Wt 291 lb 9.6 oz (132.3 kg)   BMI 44.34 kg/m    BP 143/76   P  71     Sitting :  BP 135/88  P 77   Standing   4 min   129/79  P 80  GEN: Morbidly obese 59 yo  in no acute distress  HEENT: normal  Neck: no JVD, carotid bruits, Cardiac: RRR; no murmurs, rubs, or gallops,  Distant   LE   Tr edema  Respiratory:  clear to auscultation bilaterally, normal work of breathing GI: soft, nontender, Obese  , + BS  No hepatomegaly  MS: no deformity Moving all extremities   Skin: warm and dry, no rash  Gash on R shin   Neuro:  Strength and sensation are intact Psych: euthymic mood, full affect   EKG:  EKG is ordered today.  SR  73 bpm      Lipid Panel No results found for: CHOL, TRIG, HDL, CHOLHDL, VLDL, LDLCALC, LDLDIRECT    Wt Readings from Last 3 Encounters:  09/14/19 291 lb 9.6 oz (132.3 kg)  09/11/19 293 lb 2 oz (133 kg)  08/31/19 293 lb 2 oz (133 kg)      ASSESSMENT AND PLAN:  1  Dzziness  Pt was light headed on Friday    Never had like that before     Today feels OK   BP goes down with standing but does not meet signif for orthsotasis     He is in and out of afib it appears   This may contribulte  Alos it does not sound like he is drinknig enough .    Recomm:  Stay hydrated     Will set up for 72 hour monitor   Also set up for echo to evaluate   LV function /RV function and atrial /ventricular sizes     2.  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  I saw him here in [place] to expedited clinic visit from Fri.   WIll follow up on tests     Current medicines are reviewed at length with the patient today.  The patient does not have concerns regarding medicines.  Signed, Dorris Carnes, MD  09/14/2019 2:06 PM    [facility] [place], [place], [place]  [address] Phone: [phone]; Fax: [phone]

## 2019-09-14 NOTE — Patient Instructions (Signed)
Medication Instructions:  No changes made *If you need a refill on your cardiac medications before your next appointment, please call your pharmacy*  Lab Work: Today: bmet, lipids, magnesium If you have labs (blood work) drawn today and your tests are completely normal, you will receive your results only by: Marland Kitchen MyChart Message (if you have MyChart) OR . A paper copy in the mail If you have any lab test that is abnormal or we need to change your treatment, we will call you to review the results.  Testing/Procedures: Your physician has recommended that you wear a Zio heart monitor for 72 hours. Heart monitors are medical devices that record the heart's electrical activity. Doctors most often use these monitors to diagnose arrhythmias. Arrhythmias are problems with the speed or rhythm of the heartbeat. The monitor is a small, portable device. You can wear one while you do your normal daily activities. This is usually used to diagnose what is causing palpitations/syncope (passing out).  Your physician has requested that you have an echocardiogram. Echocardiography is a painless test that uses sound waves to create images of your heart. It provides your doctor with information about the size and shape of your heart and how well your heart's chambers and valves are working. This procedure takes approximately one hour. There are no restrictions for this procedure.  THIS IS TO BE DONE AT [place]: At [facility], you and your health needs are our priority.  As part of our continuing mission to provide you with exceptional heart care, we have created designated Provider Care Teams.  These Care Teams include your primary Cardiologist (physician) and Advanced Practice Providers (APPs -  Physician Assistants and Nurse Practitioners) who all work together to provide you with the care you need, when you need it.  Your next appointment:   6-8 WEEKS  The format for your next  appointment:   In Person  Provider:   FOLLOW UP TO BE IN [place] may see No primary care provider on file. or one of the following Advanced Practice Providers on your designated Care Team:    Murray Hodgkins, NP  Christell Faith, PA-C  Marrianne Mood, PA-C   Other Instructions

## 2019-09-15 LAB — MAGNESIUM: Magnesium: 1.9 mg/dL (ref 1.6–2.3)

## 2019-09-15 LAB — BASIC METABOLIC PANEL
BUN/Creatinine Ratio: 12 (ref 9–20)
BUN: 11 mg/dL (ref 6–24)
CO2: 25 mmol/L (ref 20–29)
Calcium: 9.7 mg/dL (ref 8.7–10.2)
Chloride: 102 mmol/L (ref 96–106)
Creatinine, Ser: 0.95 mg/dL (ref 0.76–1.27)
GFR calc Af Amer: 103 mL/min/{1.73_m2} (ref >59)
GFR calc non Af Amer: 89 mL/min/{1.73_m2} (ref >59)
Glucose: 125 mg/dL — ABNORMAL HIGH (ref 65–99)
Potassium: 3.9 mmol/L (ref 3.5–5.2)
Sodium: 140 mmol/L (ref 134–144)

## 2019-09-15 LAB — LIPID PANEL
Chol/HDL Ratio: 5.1 ratio — ABNORMAL HIGH (ref 0.0–5.0)
Cholesterol, Total: 185 mg/dL (ref 100–199)
HDL: 36 mg/dL — ABNORMAL LOW (ref >39)
LDL Chol Calc (NIH): 106 mg/dL — ABNORMAL HIGH (ref 0–99)
Triglycerides: 252 mg/dL — ABNORMAL HIGH (ref 0–149)
VLDL Cholesterol Cal: 43 mg/dL — ABNORMAL HIGH (ref 5–40)

## 2019-09-17 ENCOUNTER — Telehealth: Payer: Self-pay | Admitting: *Deleted

## 2019-09-17 ENCOUNTER — Telehealth: Payer: Self-pay | Admitting: Internal Medicine

## 2019-09-17 MED ORDER — ROSUVASTATIN CALCIUM 20 MG PO TABS: 20.0000 mg | ORAL_TABLET | Freq: Every day | ORAL | 3 refills | Status: AC

## 2019-09-17 NOTE — Telephone Encounter (Signed)
-----   Message from Dorris Carnes V, MD sent at 09/15/2019  3:10 PM EST ----- Electrolyte normal  Potassium normal Cholesterol panel:   LDL is 106   With coronary artery disease, LDL(bad cholesterol ) should be close to 70.  WOuld stop Zocor   Start Crestor 20 mg    Check lipid panel in 8 weeks   Can be done through [facility] office  Triglycerides 252   Watch / cut back on carbohydrates (bread, pasta, sweet drinks)

## 2019-09-17 NOTE — Telephone Encounter (Signed)
OK 

## 2019-09-17 NOTE — Telephone Encounter (Signed)
[facility] faxed request for medical records to [facility], unable to find [facility].  09/17/19  KLM

## 2019-09-17 NOTE — Telephone Encounter (Signed)
I spoke with patient. He was made aware of lab results. He will stop Zocor and start Crestor 20 mg daily.  He wanted to let Dr. Harrington Challenger know that he decided 2 days ago to go back on his carvedilol 25 mg BID, instead of Toprol.  He said he wasn't always getting dizzy but would feel funny or weird in the afternoons and this does not happen on the carvedilol  I told him I'd let Dr. Harrington Challenger know.  If ok to continue this way I will change his medicine list.  Echo is 2/11 and follow up is arranged in [place].  He will discuss getting lipids rechecked at that visit.

## 2019-09-19 ENCOUNTER — Ambulatory Visit (INDEPENDENT_AMBULATORY_CARE_PROVIDER_SITE_OTHER): Payer: Medicare HMO

## 2019-09-19 DIAGNOSIS — I480 Paroxysmal atrial fibrillation: Secondary | ICD-10-CM | POA: Diagnosis not present

## 2019-09-24 ENCOUNTER — Encounter: Payer: Self-pay | Admitting: Gastroenterology

## 2019-09-24 ENCOUNTER — Other Ambulatory Visit: Payer: Self-pay

## 2019-09-25 ENCOUNTER — Other Ambulatory Visit
Admission: RE | Admit: 2019-09-25 | Discharge: 2019-09-25 | Disposition: A | Discharge status: Home / Self Care | Payer: Medicare HMO | Source: Ambulatory Visit | Attending: Gastroenterology | Admitting: Gastroenterology

## 2019-09-25 DIAGNOSIS — Z20822 Contact with and (suspected) exposure to covid-19: Secondary | ICD-10-CM | POA: Insufficient documentation

## 2019-09-25 DIAGNOSIS — Z01812 Encounter for preprocedural laboratory examination: Secondary | ICD-10-CM | POA: Diagnosis present

## 2019-09-25 LAB — SARS CORONAVIRUS 2 (TAT 6-24 HRS): SARS Coronavirus 2: NEGATIVE

## 2019-09-28 NOTE — Discharge Instructions (Signed)
General Anesthesia, Adult, Care After This sheet gives you information about how to care for yourself after your procedure. Your health care provider may also give you more specific instructions. If you have problems or questions, contact your health care provider. What can I expect after the procedure? After the procedure, the following side effects are common:  Pain or discomfort at the IV site.  Nausea.  Vomiting.  Sore throat.  Trouble concentrating.  Feeling cold or chills.  Weak or tired.  Sleepiness and fatigue.  Soreness and body aches. These side effects can affect parts of the body that were not involved in surgery. Follow these instructions at home:  For at least 24 hours after the procedure:  Have a responsible adult stay with you. It is important to have someone help care for you until you are awake and alert.  Rest as needed.  Do not: ? Participate in activities in which you could fall or become injured. ? Drive. ? Use heavy machinery. ? Drink alcohol. ? Take sleeping pills or medicines that cause drowsiness. ? Make important decisions or sign legal documents. ? Take care of children on your own. Eating and drinking  Follow any instructions from your health care provider about eating or drinking restrictions.  When you feel hungry, start by eating small amounts of foods that are soft and easy to digest (bland), such as toast. Gradually return to your regular diet.  Drink enough fluid to keep your urine pale yellow.  If you vomit, rehydrate by drinking water, juice, or clear broth. General instructions  If you have sleep apnea, surgery and certain medicines can increase your risk for breathing problems. Follow instructions from your health care provider about wearing your sleep device: ? Anytime you are sleeping, including during daytime naps. ? While taking prescription pain medicines, sleeping medicines, or medicines that make you drowsy.  Return to  your normal activities as told by your health care provider. Ask your health care provider what activities are safe for you.  Take over-the-counter and prescription medicines only as told by your health care provider.  If you smoke, do not smoke without supervision.  Keep all follow-up visits as told by your health care provider. This is important. Contact a health care provider if:  You have nausea or vomiting that does not get better with medicine.  You cannot eat or drink without vomiting.  You have pain that does not get better with medicine.  You are unable to pass urine.  You develop a skin rash.  You have a fever.  You have redness around your IV site that gets worse. Get help right away if:  You have difficulty breathing.  You have chest pain.  You have blood in your urine or stool, or you vomit blood. Summary  After the procedure, it is common to have a sore throat or nausea. It is also common to feel tired.  Have a responsible adult stay with you for the first 24 hours after general anesthesia. It is important to have someone help care for you until you are awake and alert.  When you feel hungry, start by eating small amounts of foods that are soft and easy to digest (bland), such as toast. Gradually return to your regular diet.  Drink enough fluid to keep your urine pale yellow.  Return to your normal activities as told by your health care provider. Ask your health care provider what activities are safe for you. This information is not   intended to replace advice given to you by your health care provider. Make sure you discuss any questions you have with your health care provider. Document Revised: 08/09/2017 Document Reviewed: 03/22/2017 Elsevier Patient Education  2020 [employer]vier Inc[employer]  

## 2019-09-29 ENCOUNTER — Encounter: Payer: Self-pay | Admitting: Gastroenterology

## 2019-09-29 ENCOUNTER — Ambulatory Visit: Payer: Medicare HMO | Admitting: Anesthesiology

## 2019-09-29 ENCOUNTER — Ambulatory Visit
Admission: RE | Admit: 2019-09-29 | Discharge: 2019-09-29 | Disposition: A | Discharge status: Home / Self Care | Payer: Medicare HMO | Source: Ambulatory Visit | Attending: Gastroenterology | Admitting: Gastroenterology

## 2019-09-29 ENCOUNTER — Encounter
Admission: RE | Disposition: A | Discharge status: Home / Self Care | Payer: Self-pay | Source: Ambulatory Visit | Attending: Gastroenterology

## 2019-09-29 ENCOUNTER — Other Ambulatory Visit: Payer: Self-pay

## 2019-09-29 DIAGNOSIS — D124 Benign neoplasm of descending colon: Secondary | ICD-10-CM | POA: Insufficient documentation

## 2019-09-29 DIAGNOSIS — I10 Essential (primary) hypertension: Secondary | ICD-10-CM | POA: Insufficient documentation

## 2019-09-29 DIAGNOSIS — Z7902 Long term (current) use of antithrombotics/antiplatelets: Secondary | ICD-10-CM | POA: Insufficient documentation

## 2019-09-29 DIAGNOSIS — Z7951 Long term (current) use of inhaled steroids: Secondary | ICD-10-CM | POA: Insufficient documentation

## 2019-09-29 DIAGNOSIS — F17210 Nicotine dependence, cigarettes, uncomplicated: Secondary | ICD-10-CM | POA: Diagnosis not present

## 2019-09-29 DIAGNOSIS — Z09 Encounter for follow-up examination after completed treatment for conditions other than malignant neoplasm: Secondary | ICD-10-CM | POA: Insufficient documentation

## 2019-09-29 DIAGNOSIS — I2510 Atherosclerotic heart disease of native coronary artery without angina pectoris: Secondary | ICD-10-CM | POA: Insufficient documentation

## 2019-09-29 DIAGNOSIS — K635 Polyp of colon: Secondary | ICD-10-CM

## 2019-09-29 DIAGNOSIS — Z7901 Long term (current) use of anticoagulants: Secondary | ICD-10-CM | POA: Insufficient documentation

## 2019-09-29 DIAGNOSIS — K5730 Diverticulosis of large intestine without perforation or abscess without bleeding: Secondary | ICD-10-CM | POA: Insufficient documentation

## 2019-09-29 DIAGNOSIS — I739 Peripheral vascular disease, unspecified: Secondary | ICD-10-CM | POA: Insufficient documentation

## 2019-09-29 DIAGNOSIS — G4730 Sleep apnea, unspecified: Secondary | ICD-10-CM | POA: Insufficient documentation

## 2019-09-29 DIAGNOSIS — J449 Chronic obstructive pulmonary disease, unspecified: Secondary | ICD-10-CM | POA: Diagnosis not present

## 2019-09-29 DIAGNOSIS — Z86010 Personal history of colonic polyps: Secondary | ICD-10-CM

## 2019-09-29 DIAGNOSIS — Z79899 Other long term (current) drug therapy: Secondary | ICD-10-CM | POA: Insufficient documentation

## 2019-09-29 DIAGNOSIS — D122 Benign neoplasm of ascending colon: Secondary | ICD-10-CM | POA: Diagnosis not present

## 2019-09-29 DIAGNOSIS — I4891 Unspecified atrial fibrillation: Secondary | ICD-10-CM | POA: Diagnosis not present

## 2019-09-29 DIAGNOSIS — E785 Hyperlipidemia, unspecified: Secondary | ICD-10-CM | POA: Diagnosis not present

## 2019-09-29 DIAGNOSIS — Z8719 Personal history of other diseases of the digestive system: Secondary | ICD-10-CM | POA: Diagnosis not present

## 2019-09-29 HISTORY — DX: Headache, unspecified: R51.9

## 2019-09-29 HISTORY — DX: Chronic obstructive pulmonary disease, unspecified: J44.9

## 2019-09-29 HISTORY — DX: Unspecified osteoarthritis, unspecified site: M19.90

## 2019-09-29 HISTORY — DX: Dizziness and giddiness: R42

## 2019-09-29 HISTORY — DX: Dyspnea, unspecified: R06.00

## 2019-09-29 HISTORY — PX: COLONOSCOPY WITH PROPOFOL: SHX5780

## 2019-09-29 SURGERY — COLONOSCOPY WITH PROPOFOL
Anesthesia: General

## 2019-09-29 MED ORDER — SODIUM CHLORIDE 0.9 % IV SOLN: INTRAVENOUS | Status: DC | PRN

## 2019-09-29 MED ORDER — LACTATED RINGERS IV SOLN: INTRAVENOUS | Status: DC

## 2019-09-29 MED ORDER — LIDOCAINE HCL (CARDIAC) PF 100 MG/5ML IV SOSY
PREFILLED_SYRINGE | INTRAVENOUS | Status: DC | PRN
  Administered 2019-09-29: 100 mg via INTRATRACHEAL

## 2019-09-29 MED ORDER — SODIUM CHLORIDE 0.9 % IV SOLN
INTRAVENOUS | Status: DC
  Administered 2019-09-29: 10:00:00 1000 mL via INTRAVENOUS

## 2019-09-29 MED ORDER — PROPOFOL 10 MG/ML IV BOLUS
INTRAVENOUS | Status: DC | PRN
  Administered 2019-09-29 (×2): 10 mg via INTRAVENOUS
  Administered 2019-09-29: 60 mg via INTRAVENOUS

## 2019-09-29 MED ORDER — PROPOFOL 500 MG/50ML IV EMUL
INTRAVENOUS | Status: DC | PRN
  Administered 2019-09-29: 160 ug/kg/min via INTRAVENOUS

## 2019-09-29 NOTE — Anesthesia Postprocedure Evaluation (Signed)
Anesthesia Post Note  Patient: Adam Yoder  Procedure(s) Performed: COLONOSCOPY WITH PROPOFOL (N/A )  Patient location during evaluation: Endoscopy Anesthesia Type: General Level of consciousness: awake and alert and oriented Pain management: pain level controlled Vital Signs Assessment: post-procedure vital signs reviewed and stable Respiratory status: spontaneous breathing, nonlabored ventilation and respiratory function stable Cardiovascular status: blood pressure returned to baseline and stable Postop Assessment: no signs of nausea or vomiting Anesthetic complications: no     Last Vitals:  Vitals:   09/29/19 1058 09/29/19 1108  BP: (!) 171/98 (!) 159/82  Pulse:    Resp:    Temp:    SpO2:  99%    Last Pain:  Vitals:   09/29/19 1108  TempSrc:   PainSc: 0-No pain                 Delanie Tirrell

## 2019-09-29 NOTE — H&P (Signed)
Adam Bellows, MD [address] [place], [place], [place], [address] [address], [place], [place], [place], [address] Phone: [phone]  Fax: [phone]  Primary Care Physician:  Kerri Perches, PA-C   Pre-Procedure History & Physical: HPI:  Adam Yoder is a 59 y.o. male is here for an colonoscopy.   Past Medical History:  Diagnosis Date  . Arthritis    shoulder, knees  . Asthma   . Atrial fibrillation ([place])   . COPD (chronic obstructive pulmonary disease) ([place])   . Coronary artery disease   . Dizziness    Recently went to ER [facility]) for dizziness and falling (approx. 2 weeks ago)  . Dyspnea   . H/O heart artery stent 2016  . Headache    2xweek  . Hyperlipidemia   . Hypertension   . PAD (peripheral artery disease) ([facility])    impaired circulation  . Sleep apnea    not using CPAP at this time    Past Surgical History:  Procedure Laterality Date  . COLONOSCOPY WITH PROPOFOL N/A 05/01/2019   Procedure: COLONOSCOPY WITH PROPOFOL;  Surgeon: Virgel Manifold, MD;  Location: [facility] ENDOSCOPY;  Service: Endoscopy;  Laterality: N/A;  . vascular bypass     right knee    Prior to Admission medications   Medication Sig Start Date End Date Taking? Authorizing Provider  albuterol (VENTOLIN HFA) 108 (90 Base) MCG/ACT inhaler Inhale 2 puffs into the lungs every 6 (six) hours as needed for wheezing or shortness of breath.   Yes [provider]  apixaban (ELIQUIS) 5 MG TABS tablet Take 5 mg by mouth 2 (two) times daily.   Yes [provider]  clopidogrel (PLAVIX) 75 MG tablet Take 75 mg by mouth daily.   Yes [provider]  FLOVENT HFA 220 MCG/ACT inhaler Inhale 1 puff into the lungs daily.  07/15/19  Yes [provider]  hydrochlorothiazide (HYDRODIURIL) 25 MG tablet Take 25 mg by mouth 2 (two) times daily. Am and pm   Yes [provider]  metoprolol tartrate (LOPRESSOR) 25 MG tablet Take 25 mg by mouth daily. am   Yes [provider]  Potassium Chloride ER 20 MEQ TBCR Take 20 mEq by mouth daily. am   Yes [provider]  pregabalin (LYRICA) 50 MG capsule Take 50 mg by mouth 3 (three) times daily. 08/27/19  Yes [provider]  rosuvastatin (CRESTOR) 20 MG tablet Take 1 tablet (20 mg total) by mouth daily. 09/17/19  Yes Fay Records, MD  metoprolol succinate (TOPROL XL) 50 MG 24 hr tablet Take 2 tablets (100 mg total) by mouth daily. Take with or immediately following a meal. Patient not taking: Reported on 09/29/2019 09/11/19 10/11/19  Duffy Bruce, MD    Allergies as of 09/09/2019  . (No Known Allergies)    History reviewed. No pertinent family history.  Social History   Socioeconomic History  . Marital status: Single    Spouse name: Not on file  . Number of children: Not on file  . Years of education: Not on file  . Highest education level: Not on file  Occupational History  . Not on file  Tobacco Use  . Smoking status: Current Every Day Smoker    Packs/day: 0.50    Years: 40.00    Pack years: 20.00    Types: Cigarettes  . Smokeless tobacco: Never Used  Substance and Sexual Activity  . Alcohol use: Not Currently    Comment: occassional   .  Drug use: Yes    Types: Marijuana    Comment: 3 days ago  . Sexual activity: Not on file  Other Topics Concern  . Not on file  Social History Narrative  . Not on file   Social Determinants of Health   Financial Resource Strain:   . Difficulty of Paying Living Expenses: Not on file  Food Insecurity:   . Worried About Charity fundraiser in the Last Year: Not on file  . Ran Out of Food in the Last Year: Not on file  Transportation Needs:   . Lack of Transportation (Medical): Not on file  . Lack of Transportation (Non-Medical): Not on file  Physical Activity:   . Days of Exercise per Week: Not on file  . Minutes of Exercise per Session: Not on file  Stress:   . Feeling of Stress : Not on file  Social Connections:   .  Frequency of Communication with Friends and Family: Not on file  . Frequency of Social Gatherings with Friends and Family: Not on file  . Attends Religious Services: Not on file  . Active Member of Clubs or Organizations: Not on file  . Attends Archivist Meetings: Not on file  . Marital Status: Not on file  Intimate Partner Violence:   . Fear of Current or Ex-Partner: Not on file  . Emotionally Abused: Not on file  . Physically Abused: Not on file  . Sexually Abused: Not on file    Review of Systems: See HPI, otherwise negative ROS  Physical Exam: BP 123/72   Pulse 70   Temp (!) 96.7 F (35.9 C) (Temporal)   Resp 20   Ht 5\' 8"  (1.727 m)   Wt 129.5 kg   SpO2 98%   BMI 43.42 kg/m  General:   Alert,  pleasant and cooperative in NAD Head:  Normocephalic and atraumatic. Neck:  Supple; no masses or thyromegaly. Lungs:  Clear throughout to auscultation, normal respiratory effort.    Heart:  +S1, +S2, Regular rate and rhythm, No edema. Abdomen:  Soft, nontender and nondistended. Normal bowel sounds, without guarding, and without rebound.   Neurologic:  Alert and  oriented x4;  grossly normal neurologically.  Impression/Plan: Adam Yoder is here for an colonoscopy to be performed for surveillance due to prior history of colon polyps   Risks, benefits, limitations, and alternatives regarding  colonoscopy have been reviewed with the patient.  Questions have been answered.  All parties agreeable.   Adam Bellows, MD  09/29/2019, 10:03 AM

## 2019-09-29 NOTE — Anesthesia Preprocedure Evaluation (Signed)
Anesthesia Evaluation  Patient identified by MRN, date of birth, ID band Patient awake    Reviewed: Allergy & Precautions, NPO status , Patient's Chart, lab work & pertinent test results  History of Anesthesia Complications Negative for: history of anesthetic complications  Airway Mallampati: III  TM Distance: >3 FB Neck ROM: Full    Dental  (+) Poor Dentition, Loose   Pulmonary asthma , sleep apnea , COPD,  COPD inhaler, Current Smoker and Patient abstained from smoking.,    breath sounds clear to auscultation- rhonchi (-) wheezing      Cardiovascular hypertension, Pt. on medications (-) angina+ CAD, + Cardiac Stents (2016) and + Peripheral Vascular Disease   Rhythm:Regular Rate:Normal - Systolic murmurs and - Diastolic murmurs    Neuro/Psych  Headaches, neg Seizures negative psych ROS   GI/Hepatic negative GI ROS, Neg liver ROS,   Endo/Other  negative endocrine ROSneg diabetes  Renal/GU negative Renal ROS     Musculoskeletal  (+) Arthritis ,   Abdominal (+) + obese,   Peds  Hematology negative hematology ROS (+)   Anesthesia Other Findings Past Medical History: No date: Arthritis     Comment:  shoulder, knees No date: Asthma No date: Atrial fibrillation ([place]) No date: COPD (chronic obstructive pulmonary disease) ([place]) No date: Coronary artery disease No date: Dizziness     Comment:  Recently went [facility] ER [facility]) for dizziness and falling               (approx. 2 weeks ago) No date: Dyspnea 2016: H/O heart artery stent No date: Headache     Comment:  2xweek No date: Hyperlipidemia No date: Hypertension No date: PAD (peripheral artery disease) ([facility])     Comment:  impaired circulation No date: Sleep apnea     Comment:  not using CPAP at this time   Reproductive/Obstetrics                             Anesthesia Physical Anesthesia Plan  ASA: III  Anesthesia Plan: General    Post-op Pain Management:    Induction: Intravenous  PONV Risk Score and Plan: 0 and Propofol infusion  Airway Management Planned: Natural Airway  Additional Equipment:   Intra-op Plan:   Post-operative Plan:   Informed Consent: I have reviewed the patients History and Physical, chart, labs and discussed the procedure including the risks, benefits and alternatives for the proposed anesthesia with the patient or authorized representative who has indicated his/her understanding and acceptance.     Dental advisory given  Plan Discussed with: CRNA and Anesthesiologist  Anesthesia Plan Comments:         Anesthesia Quick Evaluation

## 2019-09-29 NOTE — Transfer of Care (Signed)
Immediate Anesthesia Transfer of Care Note  Patient: Adam Yoder  Procedure(s) Performed: COLONOSCOPY WITH PROPOFOL (N/A )  Patient Location: Endoscopy Unit  Anesthesia Type:General  Level of Consciousness: awake, drowsy and patient cooperative  Airway & Oxygen Therapy: Patient Spontanous Breathing and Patient connected to face mask oxygen  Post-op Assessment: Report given to RN and Post -op Vital signs reviewed and stable  Post vital signs: Reviewed and stable  Last Vitals:  Vitals Value Taken Time  BP 145/93 09/29/19 1039  Temp 36.2 C 09/29/19 1038  Pulse 93 09/29/19 1040  Resp 18 09/29/19 1040  SpO2 98 % 09/29/19 1040  Vitals shown include unvalidated device data.  Last Pain:  Vitals:   09/29/19 1038  TempSrc: Temporal  PainSc: Asleep         Complications: No apparent anesthesia complications

## 2019-09-29 NOTE — Op Note (Addendum)
[facility] Gastroenterology Patient Name: Adam Yoder Procedure Date: 09/29/2019 10:04 AM MRN: [id number] Account #: [id number] Date of Birth: Mar 08, 1961 Admit Type: Outpatient Age: 59 Room: [facility] ENDO ROOM 3 Gender: Male Note Status: Finalized Procedure:             Colonoscopy Indications:           High risk colon cancer surveillance: Personal history                         of colonic polyps Providers:             Jonathon Bellows MD, MD Referring MD:          No Local Md, MD (Referring MD) Medicines:             Monitored Anesthesia Care Complications:         No immediate complications. Procedure:             Pre-Anesthesia Assessment:                        - Prior to the procedure, a History and Physical was                         performed, and patient medications, allergies and                         sensitivities were reviewed. The patient's tolerance                         of previous anesthesia was reviewed.                        - The risks and benefits of the procedure and the                         sedation options and risks were discussed with the                         patient. All questions were answered and informed                         consent was obtained.                        - ASA Grade Assessment: III - A patient with severe                         systemic disease.                        After obtaining informed consent, the colonoscope was                         passed under direct vision. Throughout the procedure,                         the patient's blood pressure, pulse, and oxygen                         saturations were monitored continuously. The  Colonoscope was introduced through the anus and                         advanced to the the cecum, identified by the                         appendiceal orifice. The colonoscopy was performed                         with ease. The patient tolerated the  procedure well.                         The quality of the bowel preparation was good. Findings:      The perianal and digital rectal examinations were normal.      Multiple small-mouthed diverticula were found in the right colon.      Eight sessile polyps were found in the ascending colon. The polyps were       4 to 6 mm in size. These polyps were removed with a cold snare.       Resection and retrieval were complete.      Two sessile polyps were found in the sigmoid colon. The polyps were 4 to       7 mm in size. These polyps were removed with a cold snare. Resection and       retrieval were complete.      The exam was otherwise without abnormality on direct and retroflexion       views. Impression:            - Diverticulosis in the right colon.                        - Eight 4 to 6 mm polyps in the ascending colon,                         removed with a cold snare. Resected and retrieved.                        - Two 4 to 7 mm polyps in the sigmoid colon, removed                         with a cold snare. Resected and retrieved.                        - The examination was otherwise normal on direct and                         retroflexion views. Recommendation:        - Discharge patient to home (with escort).                        - Resume previous diet.                        - Continue present medications.                        - Await pathology results.                        -  Repeat colonoscopy in 1 year for surveillance.                        - Refer for genetic testing to r/o attenuated FAP and                         follow up with Dr Bonna Gains in 3 months Procedure Code(s):     --- Professional ---                        [phone], Colonoscopy, flexible; with removal of                         tumor(s), polyp(s), or other lesion(s) by snare                         technique Diagnosis Code(s):     --- Professional ---                        Z86.010, Personal history of  colonic polyps                        K63.5, Polyp of colon                        K57.30, Diverticulosis of large intestine without                         perforation or abscess without bleeding CPT copyright 2019 American Medical Association. All rights reserved. The codes documented in this report are preliminary and upon coder review may  be revised to meet current compliance requirements. Jonathon Bellows, MD Jonathon Bellows MD, MD 09/29/2019 10:37:26 AM This report has been signed electronically. Number of Addenda: 0 Note Initiated On: 09/29/2019 10:04 AM Scope Withdrawal Time: 0 hours 20 minutes 29 seconds  Total Procedure Duration: 0 hours 22 minutes 26 seconds  Estimated Blood Loss:  Estimated blood loss: none.      [facility]

## 2019-09-30 ENCOUNTER — Encounter: Payer: Self-pay | Admitting: *Deleted

## 2019-09-30 LAB — SURGICAL PATHOLOGY

## 2019-10-01 ENCOUNTER — Other Ambulatory Visit: Payer: Self-pay

## 2019-10-01 ENCOUNTER — Ambulatory Visit (INDEPENDENT_AMBULATORY_CARE_PROVIDER_SITE_OTHER): Payer: Medicare HMO

## 2019-10-01 DIAGNOSIS — I480 Paroxysmal atrial fibrillation: Secondary | ICD-10-CM

## 2019-10-06 ENCOUNTER — Other Ambulatory Visit: Payer: Self-pay

## 2019-10-06 DIAGNOSIS — Z86010 Personal history of colonic polyps: Secondary | ICD-10-CM

## 2019-10-12 ENCOUNTER — Telehealth: Payer: Self-pay

## 2019-10-12 NOTE — Telephone Encounter (Signed)
-----   Message from Jonathon Bellows, MD sent at 10/11/2019 10:26 AM EST ----- Inform  1. Numerous tubular adenomas- repeat colonoscopy in 1 year 2. Refer for genetic testing due to number of adenomas  Dr Jonathon Bellows MD,MRCP [facility]) Gastroenterology/Hepatology Pager: [phone]

## 2019-10-12 NOTE — Telephone Encounter (Signed)
-----   Message from Jonathon Bellows, MD sent at 10/11/2019 10:26 AM EST ----- Inform  1. Numerous tubular adenomas- repeat colonoscopy in 1 year 2. Refer for genetic testing due to number of adenomas  Dr Jonathon Bellows MD,MRCP [facility]) Gastroenterology/Hepatology Pager: [phone]

## 2019-10-12 NOTE — Telephone Encounter (Signed)
Called pt to informed him of biopsy results and Dr. Georgeann Oppenheim recommendations.  Unable to contact, LVM to return call

## 2019-10-12 NOTE — Telephone Encounter (Signed)
Spoke with pt and notified him of biopsy results and Dr. Georgeann Oppenheim recommendations. Pt understands and agrees.

## 2019-10-15 ENCOUNTER — Ambulatory Visit: Payer: Medicare HMO | Attending: Internal Medicine

## 2019-10-15 DIAGNOSIS — Z20822 Contact with and (suspected) exposure to covid-19: Secondary | ICD-10-CM

## 2019-10-16 LAB — NOVEL CORONAVIRUS, NAA: SARS-CoV-2, NAA: NOT DETECTED

## 2019-10-26 ENCOUNTER — Ambulatory Visit: Payer: Medicare HMO | Admitting: Physician Assistant

## 2020-09-10 IMAGING — CT CT ANGIO CHEST
2 of 6 series · 18 of 46 positions shown · IV contrast (APPLIED)
Comparison: Chest radiograph dated 09/09/2019.

CLINICAL DATA: 58-year-old male with shortness of breath.

EXAM:
CT ANGIOGRAPHY CHEST WITH CONTRAST
TECHNIQUE: Multidetector CT imaging of the chest was performed using the
standard protocol during bolus administration of intravenous
contrast. Multiplanar CT image reconstructions and MIPs were
obtained to evaluate the vascular anatomy.
CONTRAST:  100mL OMNIPAQUE IOHEXOL 350 MG/ML SOLN

[Series 5: thins · axial · 0.83mm/px · z∈[-588,-308]mm · 15 of 308 slices shown]
[im 14/308  lung]
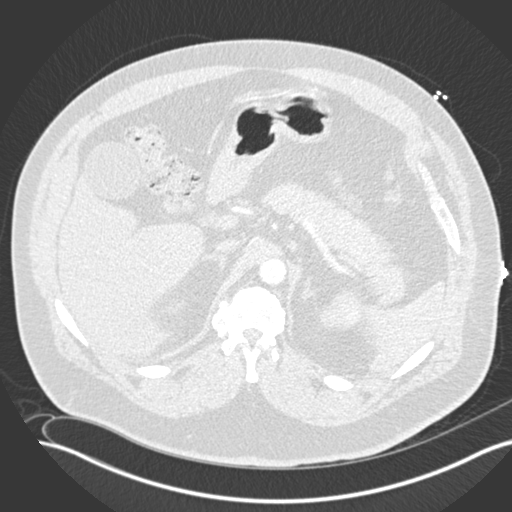
[im 41/308  soft-tissue]
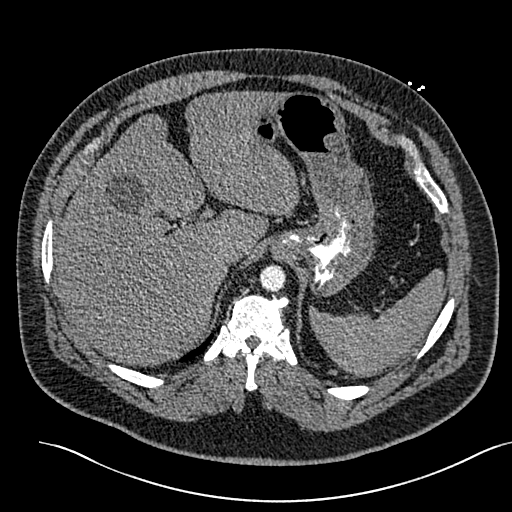
[im 54/308  lung]
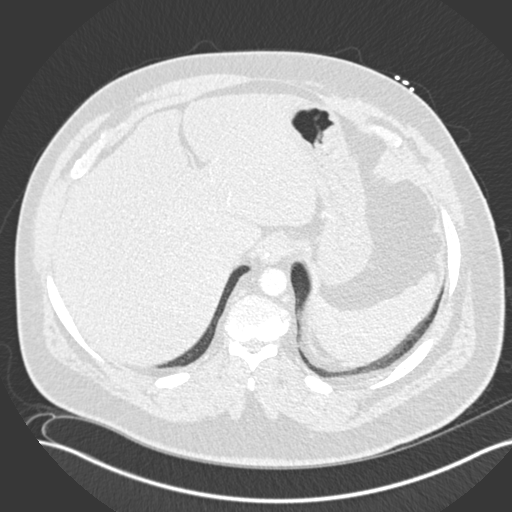
[im 81/308  soft-tissue]
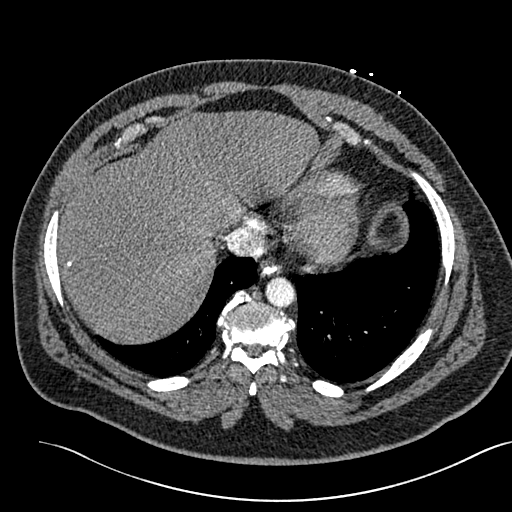
[im 94/308  lung]
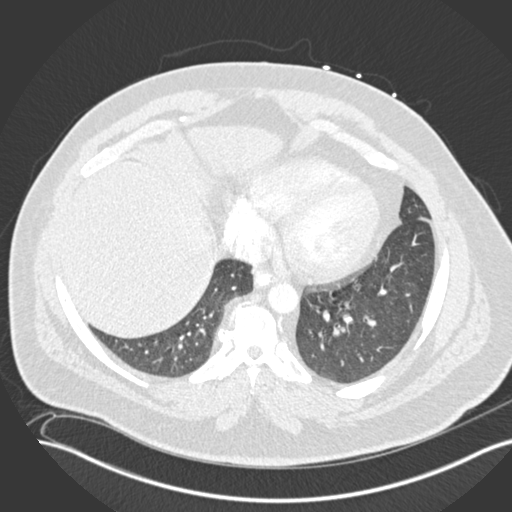
[im 121/308  soft-tissue]
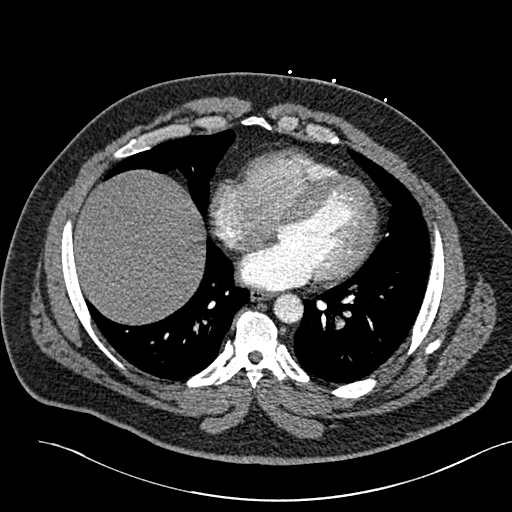
[im 134/308  lung]
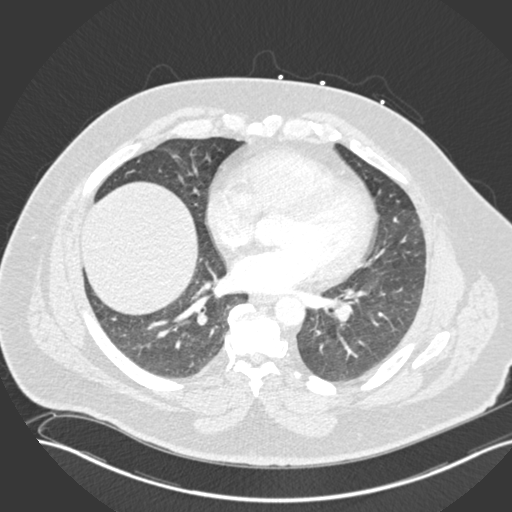
[im 161/308  soft-tissue]
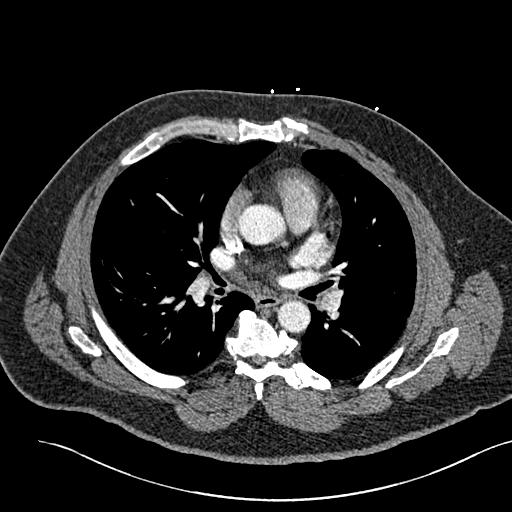
[im 174/308  lung]
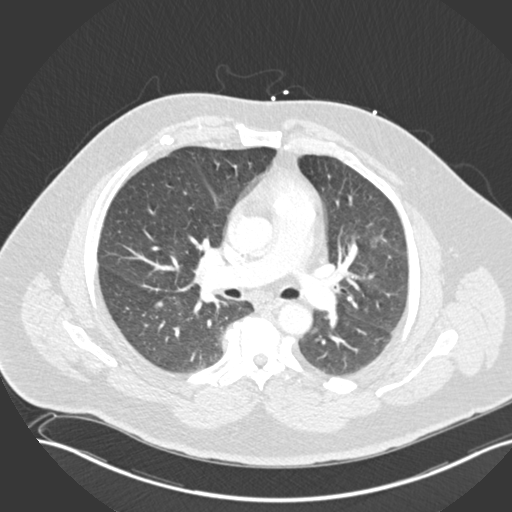
[im 187/308  soft-tissue]
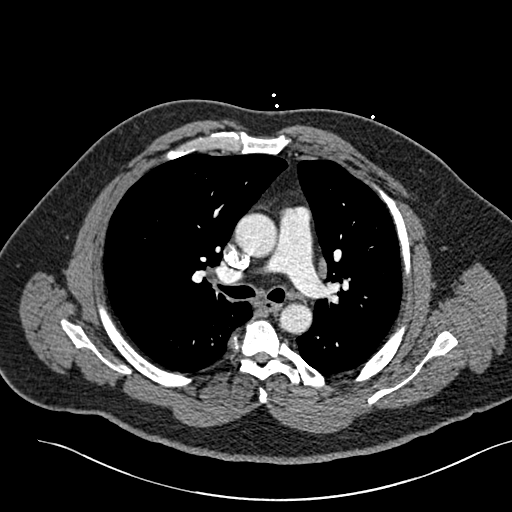
[im 214/308  lung]
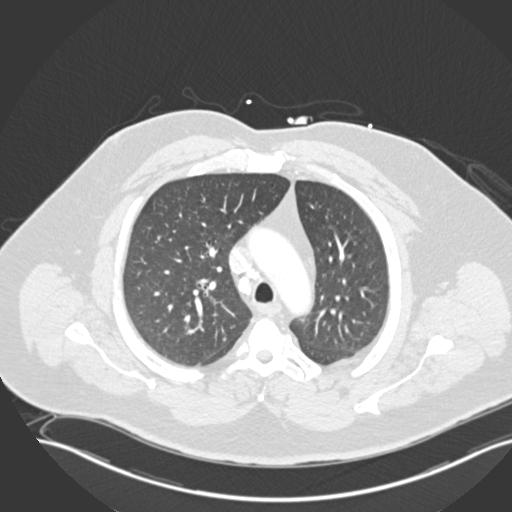
[im 227/308  soft-tissue]
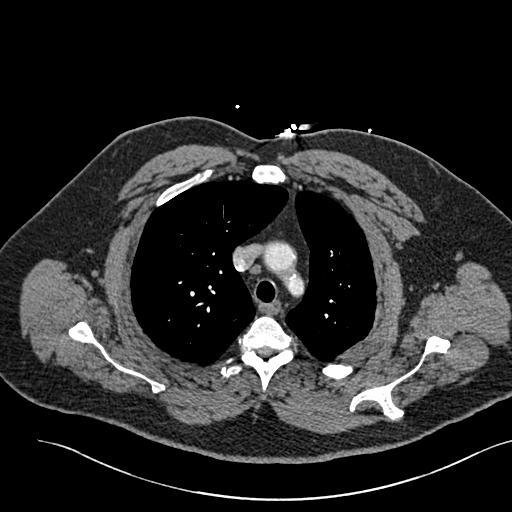
[im 254/308  lung]
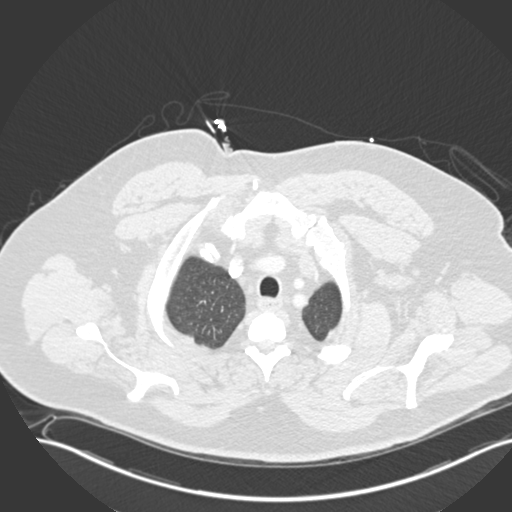
[im 267/308  soft-tissue]
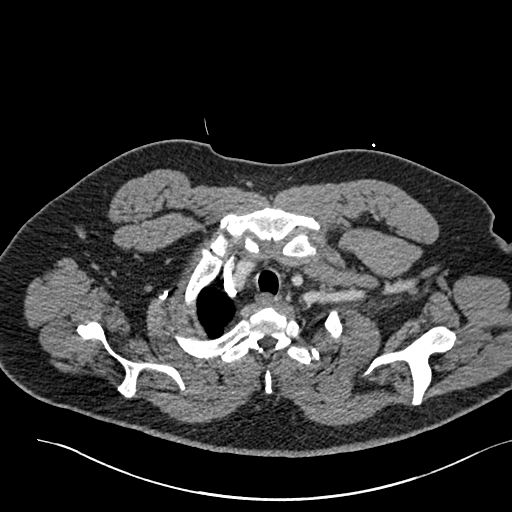
[im 294/308  lung]
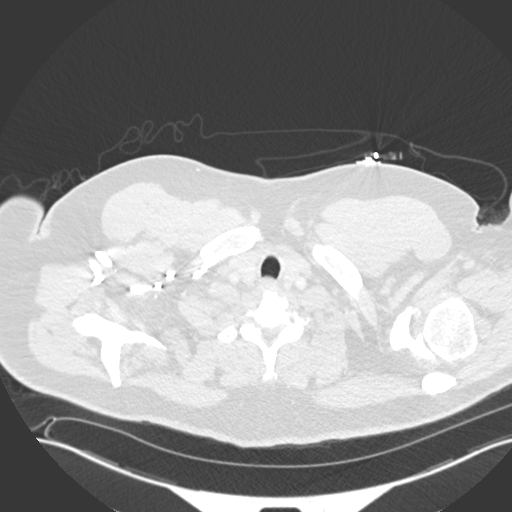

[Series 7: coronal mpr · coronal · 0.60mm/px · 3 of 106 slices shown]
[im 27/106  soft-tissue]
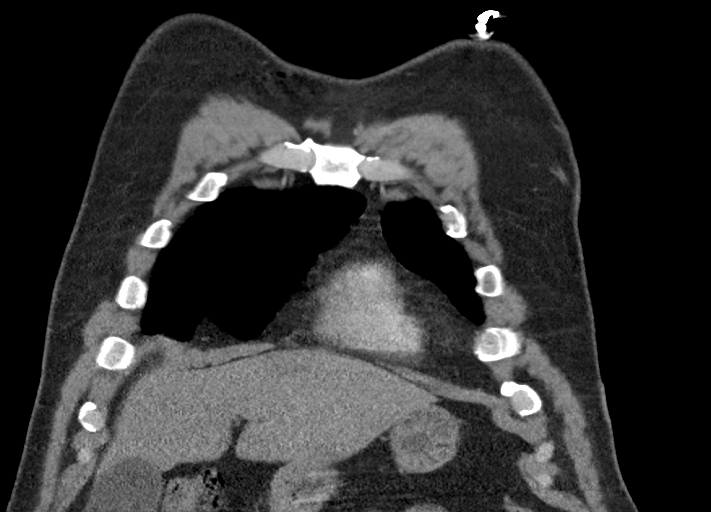
[im 53/106  soft-tissue]
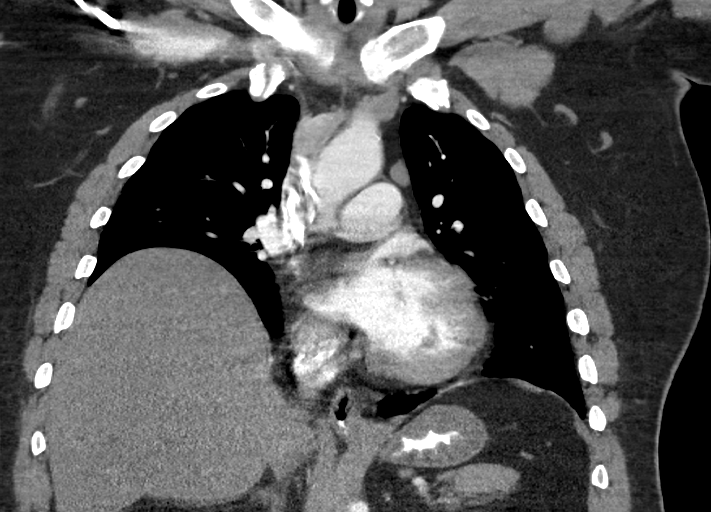
[im 79/106  soft-tissue]
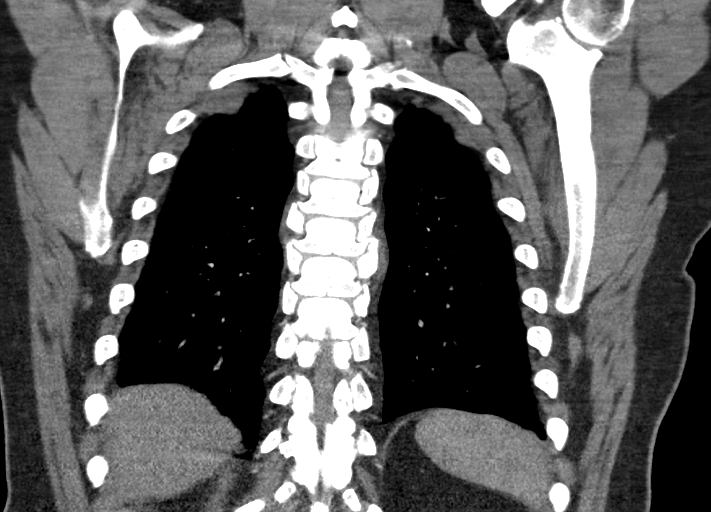

[18 of 46 positions shown; findings below may reference images not displayed]

FINDINGS: Cardiovascular: There is no cardiomegaly or pericardial effusion.
The thoracic aorta is unremarkable. Evaluation of the pulmonary
arteries is limited due to respiratory motion artifact and
suboptimal opacification and timing of the contrast. No large
central pulmonary artery embolus identified.

Mediastinum/Nodes: There is no hilar or mediastinal adenopathy. The
esophagus is grossly unremarkable. No mediastinal fluid collection.
Several calcified lymph nodes noted in the upper mediastinum.

Lungs/Pleura: The lungs are clear. There is no pleural effusion or
pneumothorax. The central airways are patent.

Upper Abdomen: Fatty infiltration of the liver. Partially calcified
stones noted within the gallbladder.

Musculoskeletal: Mild degenerative changes of the spine. No acute
osseous pathology.

Review of the MIP images confirms the above findings.
IMPRESSION: No acute intrathoracic pathology. No CT evidence of large central
pulmonary artery embolus.
